# Patient Record
Sex: Female | Born: 1990
Health system: Southern US, Community
[De-identification: ages and names within clinical notes are randomized; demographics above are authoritative.]

## PROBLEM LIST (undated history)

## (undated) DIAGNOSIS — L509 Urticaria, unspecified: Secondary | ICD-10-CM

## (undated) DIAGNOSIS — J45909 Unspecified asthma, uncomplicated: Secondary | ICD-10-CM

## (undated) DIAGNOSIS — D509 Iron deficiency anemia, unspecified: Secondary | ICD-10-CM

## (undated) DIAGNOSIS — T7840XA Allergy, unspecified, initial encounter: Secondary | ICD-10-CM

## (undated) HISTORY — PX: WISDOM TOOTH EXTRACTION: SHX21

## (undated) HISTORY — DX: Allergy, unspecified, initial encounter: T78.40XA

## (undated) HISTORY — DX: Urticaria, unspecified: L50.9

## (undated) HISTORY — DX: Iron deficiency anemia, unspecified: D50.9

---
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
## 2012-10-01 ENCOUNTER — Emergency Department: Payer: Self-pay | Admitting: Emergency Medicine

## 2012-10-01 LAB — URINALYSIS, COMPLETE
Bilirubin,UR: NEGATIVE
Blood: NEGATIVE
Ketone: NEGATIVE
Ph: 6 (ref 4.5–8.0)
Protein: NEGATIVE
RBC,UR: <1 /HPF (ref 0–5)
Specific Gravity: 1.019 (ref 1.003–1.030)
Squamous Epithelial: 3

## 2012-10-01 LAB — COMPREHENSIVE METABOLIC PANEL
Albumin: 3.9 g/dL (ref 3.8–5.6)
Alkaline Phosphatase: 78 U/L — ABNORMAL LOW (ref 82–169)
Bilirubin,Total: 0.3 mg/dL (ref 0.2–1.0)
Creatinine: 0.65 mg/dL (ref 0.60–1.30)
Osmolality: 278 (ref 275–301)
Total Protein: 7.5 g/dL (ref 6.4–8.6)

## 2012-10-01 LAB — CBC
HCT: 39.3 % (ref 35.0–47.0)
HGB: 12.6 g/dL (ref 12.0–16.0)
MCH: 27.3 pg (ref 26.0–34.0)
MCHC: 32.1 g/dL (ref 32.0–36.0)
Platelet: 325 10*3/uL (ref 150–440)
RBC: 4.61 10*6/uL (ref 3.80–5.20)
WBC: 8.2 10*3/uL (ref 3.6–11.0)

## 2013-01-17 ENCOUNTER — Emergency Department: Payer: Self-pay | Admitting: Emergency Medicine

## 2013-05-29 ENCOUNTER — Emergency Department: Payer: Self-pay | Admitting: Emergency Medicine

## 2013-05-29 LAB — CBC
MCV: 82 fL (ref 80–100)
Platelet: 343 10*3/uL (ref 150–440)
RBC: 4.8 10*6/uL (ref 3.80–5.20)
WBC: 10.8 10*3/uL (ref 3.6–11.0)

## 2013-05-29 LAB — GC/CHLAMYDIA PROBE AMP

## 2013-05-29 LAB — URINALYSIS, COMPLETE
Bacteria: NONE SEEN
Blood: NEGATIVE
Glucose,UR: NEGATIVE mg/dL (ref 0–75)
Nitrite: NEGATIVE
Ph: 5 (ref 4.5–8.0)
RBC,UR: <1 /HPF (ref 0–5)
Specific Gravity: 1.021 (ref 1.003–1.030)
Squamous Epithelial: 2

## 2013-05-29 LAB — COMPREHENSIVE METABOLIC PANEL
Albumin: 3.5 g/dL (ref 3.4–5.0)
Calcium, Total: 8.9 mg/dL (ref 8.5–10.1)
EGFR (African American): >60
SGOT(AST): 18 U/L (ref 15–37)
SGPT (ALT): 15 U/L (ref 12–78)
Total Protein: 7.1 g/dL (ref 6.4–8.2)

## 2013-05-29 LAB — LIPASE, BLOOD: Lipase: 68 U/L — ABNORMAL LOW (ref 73–393)

## 2013-05-29 LAB — WET PREP, GENITAL

## 2013-07-09 ENCOUNTER — Emergency Department: Payer: Self-pay | Admitting: Emergency Medicine

## 2013-07-10 LAB — CBC WITH DIFFERENTIAL/PLATELET
Basophil #: 0.1 10*3/uL (ref 0.0–0.1)
HCT: 38.9 % (ref 35.0–47.0)
Lymphocyte %: 15.7 %
Monocyte #: 0.8 x10 3/mm (ref 0.2–0.9)
Neutrophil %: 76.3 %
Platelet: 327 10*3/uL (ref 150–440)
RBC: 4.76 10*6/uL (ref 3.80–5.20)
RDW: 15.8 % — ABNORMAL HIGH (ref 11.5–14.5)

## 2013-07-10 LAB — DRUG SCREEN, URINE
Amphetamines, Ur Screen: NEGATIVE (ref ?–1000)
Cocaine Metabolite,Ur ~~LOC~~: NEGATIVE (ref ?–300)
MDMA (Ecstasy)Ur Screen: NEGATIVE (ref ?–500)
Tricyclic, Ur Screen: NEGATIVE (ref ?–1000)

## 2013-07-10 LAB — URINALYSIS, COMPLETE
Bacteria: NONE SEEN
Squamous Epithelial: 2
WBC UR: 27 /HPF (ref 0–5)

## 2013-07-10 LAB — BASIC METABOLIC PANEL
Anion Gap: 9 (ref 7–16)
BUN: 10 mg/dL (ref 7–18)
Calcium, Total: 8.7 mg/dL (ref 8.5–10.1)
Co2: 20 mmol/L — ABNORMAL LOW (ref 21–32)
EGFR (African American): >60
Potassium: 4.3 mmol/L (ref 3.5–5.1)

## 2013-07-10 LAB — ETHANOL: Ethanol: <3 mg/dL

## 2013-07-10 LAB — ACETAMINOPHEN LEVEL: Acetaminophen: <2 ug/mL

## 2013-07-10 LAB — TSH: Thyroid Stimulating Horm: 1.95 u[IU]/mL

## 2013-09-08 ENCOUNTER — Emergency Department: Payer: Self-pay | Admitting: Emergency Medicine

## 2014-11-23 ENCOUNTER — Emergency Department (HOSPITAL_COMMUNITY)
Admission: EM | Admit: 2014-11-23 | Discharge: 2014-11-23 | Disposition: A | Discharge status: Home / Self Care | Payer: 59 | Attending: Emergency Medicine | Admitting: Emergency Medicine

## 2014-11-23 ENCOUNTER — Encounter (HOSPITAL_COMMUNITY): Payer: Self-pay | Admitting: Emergency Medicine

## 2014-11-23 DIAGNOSIS — R110 Nausea: Secondary | ICD-10-CM | POA: Insufficient documentation

## 2014-11-23 DIAGNOSIS — Z3202 Encounter for pregnancy test, result negative: Secondary | ICD-10-CM | POA: Insufficient documentation

## 2014-11-23 DIAGNOSIS — R1031 Right lower quadrant pain: Secondary | ICD-10-CM | POA: Diagnosis present

## 2014-11-23 DIAGNOSIS — R109 Unspecified abdominal pain: Secondary | ICD-10-CM

## 2014-11-23 LAB — CBC WITH DIFFERENTIAL/PLATELET
Basophils Absolute: 0.1 10*3/uL (ref 0.0–0.1)
Basophils Relative: 1 % (ref 0–1)
Eosinophils Absolute: 0.2 10*3/uL (ref 0.0–0.7)
Eosinophils Relative: 3 % (ref 0–5)
HCT: 36.8 % (ref 36.0–46.0)
Hemoglobin: 11.7 g/dL — ABNORMAL LOW (ref 12.0–15.0)
Lymphocytes Relative: 21 % (ref 12–46)
Lymphs Abs: 2.1 10*3/uL (ref 0.7–4.0)
MCH: 25.5 pg — ABNORMAL LOW (ref 26.0–34.0)
MCHC: 31.8 g/dL (ref 30.0–36.0)
MCV: 80.3 fL (ref 78.0–100.0)
Monocytes Absolute: 0.7 10*3/uL (ref 0.1–1.0)
Monocytes Relative: 7 % (ref 3–12)
Neutro Abs: 6.6 10*3/uL (ref 1.7–7.7)
Neutrophils Relative %: 68 % (ref 43–77)
Platelets: 428 10*3/uL — ABNORMAL HIGH (ref 150–400)
RBC: 4.58 MIL/uL (ref 3.87–5.11)
RDW: 15.7 % — ABNORMAL HIGH (ref 11.5–15.5)
WBC: 9.7 10*3/uL (ref 4.0–10.5)

## 2014-11-23 LAB — PREGNANCY, URINE: Preg Test, Ur: NEGATIVE

## 2014-11-23 LAB — COMPREHENSIVE METABOLIC PANEL
ALBUMIN: 4 g/dL (ref 3.5–5.2)
ALK PHOS: 72 U/L (ref 39–117)
ALT: 17 U/L (ref 0–35)
ANION GAP: 5 (ref 5–15)
AST: 19 U/L (ref 0–37)
BILIRUBIN TOTAL: 0.4 mg/dL (ref 0.3–1.2)
BUN: 11 mg/dL (ref 6–23)
CHLORIDE: 107 meq/L (ref 96–112)
CO2: 24 mmol/L (ref 19–32)
CREATININE: 0.7 mg/dL (ref 0.50–1.10)
Calcium: 9.3 mg/dL (ref 8.4–10.5)
GFR calc Af Amer: >90 mL/min (ref >90)
GFR calc non Af Amer: >90 mL/min (ref >90)
Glucose, Bld: 95 mg/dL (ref 70–99)
Potassium: 4.5 mmol/L (ref 3.5–5.1)
Sodium: 136 mmol/L (ref 135–145)
Total Protein: 7.7 g/dL (ref 6.0–8.3)

## 2014-11-23 LAB — URINALYSIS, ROUTINE W REFLEX MICROSCOPIC
Bilirubin Urine: NEGATIVE
Glucose, UA: NEGATIVE mg/dL
Ketones, ur: NEGATIVE mg/dL
Leukocytes, UA: NEGATIVE
Nitrite: NEGATIVE
Protein, ur: NEGATIVE mg/dL
Specific Gravity, Urine: 1.025 (ref 1.005–1.030)
Urobilinogen, UA: 0.2 mg/dL (ref 0.0–1.0)
pH: 6 (ref 5.0–8.0)

## 2014-11-23 LAB — URINE MICROSCOPIC-ADD ON

## 2014-11-23 MED ORDER — IBUPROFEN 800 MG PO TABS
800.0000 mg | ORAL_TABLET | Freq: Once | ORAL | Status: AC
  Administered 2014-11-23: 800 mg via ORAL
  Filled 2014-11-23: qty 1

## 2014-11-23 MED ORDER — IBUPROFEN 800 MG PO TABS: 800.0000 mg | ORAL_TABLET | Freq: Three times a day (TID) | ORAL | Status: DC | PRN

## 2014-11-23 NOTE — ED Provider Notes (Signed)
CSN: [id number]     Arrival date & time 11/23/14  1650 History   First MD Initiated Contact with Patient 11/23/14 1659     This chart was scribed for Brenda Lennert, MD by [name], ED Scribe. This patient was seen in room APA15/APA15 and the patient's care was started 5:00 PM.   Chief Complaint  Patient presents with  . Abdominal Pain   Patient is a 24 y.o. female presenting with abdominal pain. The history is provided by the patient. No language interpreter was used.  Abdominal Pain Pain location:  RLQ Pain radiates to:  Does not radiate Pain severity:  Moderate Duration:  1 day Timing:  Constant Progression:  Worsening Chronicity:  Recurrent Relieved by:  None tried Worsened by:  Nothing tried Ineffective treatments:  None tried Associated symptoms: nausea   Associated symptoms: no chills, no diarrhea, no fever and no vomiting   Nausea:    Severity:  Mild   Onset quality:  Gradual   Duration:  1 day   Timing:  Constant   Progression:  Worsening   HPI Comments: [name] is a 24 y.o. female with a PMHx of ovarian cyst who presents to the Emergency Department complaining of constant, moderate RLQ abdominal pain x 1 day that has progressively worsened today. Pt also reports associated nausea. She has not tried any OTC medications or home remedies to help manage symptoms. No recent vomiting, diarrhea, fever, or urinary issues. Pt was seen previously approximately 3 years ago for same and was diagnosed with an ovarian cyst. No recent follow up since last ED visit. No known allergies to medications.  History reviewed. No pertinent past medical history. History reviewed. No pertinent past surgical history. History reviewed. No pertinent family history. History  Substance Use Topics  . Smoking status: Never Smoker   . Smokeless tobacco: Never Used  . Alcohol Use: No   OB History    No data available     Review of Systems  Constitutional: Negative for fever and  chills.  Gastrointestinal: Positive for nausea and abdominal pain. Negative for vomiting and diarrhea.  All other systems reviewed and are negative.     Allergies  Review of patient's allergies indicates no known allergies.  Home Medications   Prior to Admission medications   Not on File   Triage Vitals: BP 103/76 mmHg  Pulse 88  Temp(Src) 98.8 F (37.1 C) (Oral)  Resp 17  Ht  (1.626 m)  Wt 220 lb 12.8 oz (100.154 kg)  BMI 37.88 kg/m2  SpO2 100%  LMP 11/23/2014   Physical Exam  Constitutional: She is oriented to person, place, and time. She appears well-developed.  HENT:  Head: Normocephalic.  Eyes: Conjunctivae and EOM are normal. No scleral icterus.  Neck: Neck supple. No thyromegaly present.  Cardiovascular: Normal rate and regular rhythm.  Exam reveals no gallop and no friction rub.   No murmur heard. Pulmonary/Chest: No stridor. She has no wheezes. She has no rales. She exhibits no tenderness.  Abdominal: She exhibits no distension. There is tenderness. There is no rebound.  Mild RLQ abdominal tenderness noted  Musculoskeletal: Normal range of motion. She exhibits no edema.  Lymphadenopathy:    She has no cervical adenopathy.  Neurological: She is oriented to person, place, and time. She exhibits normal muscle tone. Coordination normal.  Skin: No rash noted. No erythema.  Psychiatric: She has a normal mood and affect. Her behavior is normal.    ED Course  Procedures (including critical care time)  DIAGNOSTIC STUDIES: Oxygen Saturation is 100% on RA, Normal by my interpretation.    COORDINATION OF CARE: 5:10 PM- Will order urinalysis, CBC, CMP, and pregnancy urine. Will give ibuprofen. Discussed treatment plan with pt at bedside and pt agreed to plan.     Labs Review Labs Reviewed  URINALYSIS, ROUTINE W REFLEX MICROSCOPIC  CBC WITH DIFFERENTIAL  COMPREHENSIVE METABOLIC PANEL    Imaging Review No results found.   EKG Interpretation None       MDM   Final diagnoses:  None  abd pain,   Pt to follow up with gyn md,  Possible ovarian cyst   I personally performed the services described in this documentation, which was scribed in my presence. The recorded information has been reviewed and is accurate.    Brenda LennertJoseph L Catherene Kaleta, MD 11/23/14 [phone]

## 2014-11-23 NOTE — Discharge Instructions (Signed)
Follow up with your gyn md next week. °

## 2014-11-23 NOTE — ED Notes (Signed)
Patient c/o right lower quad pain with nausea that started yesterday but progressively getting worse. Denies any vomiting, diarrhea, fevers, or urinary symptoms.

## 2015-07-24 ENCOUNTER — Emergency Department: Payer: 59

## 2015-07-24 ENCOUNTER — Emergency Department
Admission: EM | Admit: 2015-07-24 | Discharge: 2015-07-25 | Disposition: A | Discharge status: Home / Self Care | Payer: 59 | Attending: Emergency Medicine | Admitting: Emergency Medicine

## 2015-07-24 DIAGNOSIS — R05 Cough: Secondary | ICD-10-CM | POA: Diagnosis not present

## 2015-07-24 DIAGNOSIS — R1013 Epigastric pain: Secondary | ICD-10-CM | POA: Diagnosis not present

## 2015-07-24 DIAGNOSIS — R079 Chest pain, unspecified: Secondary | ICD-10-CM | POA: Insufficient documentation

## 2015-07-24 LAB — BASIC METABOLIC PANEL
ANION GAP: 4 — AB (ref 5–15)
BUN: 13 mg/dL (ref 6–20)
CHLORIDE: 110 mmol/L (ref 101–111)
CO2: 24 mmol/L (ref 22–32)
Calcium: 8.9 mg/dL (ref 8.9–10.3)
Creatinine, Ser: 0.7 mg/dL (ref 0.44–1.00)
GFR calc non Af Amer: >60 mL/min (ref >60)
GLUCOSE: 97 mg/dL (ref 65–99)
Potassium: 3.7 mmol/L (ref 3.5–5.1)
Sodium: 138 mmol/L (ref 135–145)

## 2015-07-24 LAB — CBC
HCT: 35.2 % (ref 35.0–47.0)
HEMOGLOBIN: 11.1 g/dL — AB (ref 12.0–16.0)
MCH: 24.6 pg — AB (ref 26.0–34.0)
MCHC: 31.4 g/dL — ABNORMAL LOW (ref 32.0–36.0)
MCV: 78.3 fL — AB (ref 80.0–100.0)
Platelets: 352 10*3/uL (ref 150–440)
RBC: 4.49 MIL/uL (ref 3.80–5.20)
RDW: 17.6 % — ABNORMAL HIGH (ref 11.5–14.5)
WBC: 11.2 10*3/uL — ABNORMAL HIGH (ref 3.6–11.0)

## 2015-07-24 LAB — TROPONIN I: Troponin I: <0.03 ng/mL (ref <0.031)

## 2015-07-24 MED ORDER — ONDANSETRON 4 MG PO TBDP
4.0000 mg | ORAL_TABLET | Freq: Once | ORAL | Status: AC
  Administered 2015-07-25: 4 mg via ORAL
  Filled 2015-07-24: qty 1

## 2015-07-24 MED ORDER — HYDROCODONE-ACETAMINOPHEN 5-325 MG PO TABS
1.0000 | ORAL_TABLET | Freq: Once | ORAL | Status: AC
  Administered 2015-07-25: 1 via ORAL
  Filled 2015-07-24: qty 1

## 2015-07-24 NOTE — ED Provider Notes (Signed)
[facility] Emergency Department Provider Note  ____________________________________________  Time seen: Approximately 11:36 PM  I have reviewed the triage vital signs and the nursing notes.   HISTORY  Chief Complaint Chest Pain    HPI [name] is a 24 y.o. female who presents to the ED from home with a chief complaint of chest pain. Patient describes onset of substernal chest pain upon awakening 2 days ago. Describes occasional sharp pain which increases with movement and deep breathing. HEENT occasionally radiates to her right arm. Denies associated diaphoresis, nausea or shortness of breath. Does state she has been having a nonproductive cough since that time as well. Denies fevers, chills, vomiting, diarrhea, dysuria, calf pain or swelling. Denies recent travel or surgery. Nothing makes the pain better. Deep breathing, movement and food makes the pain worse.   Past medical history Asthma   There are no active problems to display for this patient.   Past surgical history Wisdom teeth extraction  Current Outpatient Rx  Name  Route  Sig  Dispense  Refill  . albuterol (PROVENTIL HFA;VENTOLIN HFA) 108 (90 BASE) MCG/ACT inhaler   Inhalation   Inhale 2 puffs into the lungs every 6 (six) hours as needed for wheezing or shortness of breath.         Marland Kitchen ibuprofen (ADVIL,MOTRIN) 800 MG tablet   Oral   Take 1 tablet (800 mg total) by mouth every 8 (eight) hours as needed.   21 tablet   0     Allergies Review of patient's allergies indicates no known allergies.  Family history Grandfather with diabetes and ? blood clot   Social History Social History  Substance Use Topics  . Smoking status: Never Smoker   . Smokeless tobacco: Never Used  . Alcohol Use: No    Review of Systems Constitutional: No fever/chills Eyes: No visual changes. ENT: No sore throat. Cardiovascular: Positive for chest pain. Respiratory: Denies shortness of  breath. Gastrointestinal: Positive for abdominal pain.  No nausea, no vomiting.  No diarrhea.  No constipation. Genitourinary: Negative for dysuria. Musculoskeletal: Negative for back pain. Skin: Negative for rash. Neurological: Negative for headaches, focal weakness or numbness.  10-point ROS otherwise negative.  ____________________________________________   PHYSICAL EXAM:  VITAL SIGNS: ED Triage Vitals  Enc Vitals Group     BP 07/24/15 2143 124/68 mmHg     Pulse Rate 07/24/15 2143 84     Resp 07/24/15 2143 18     Temp 07/24/15 2143 98.8 F (37.1 C)     Temp Source 07/24/15 2143 Oral     SpO2 07/24/15 2143 99 %     Weight 07/24/15 2143 215 lb (97.523 kg)     Height 07/24/15 2143  (1.626 m)     Head Cir --      Peak Flow --      Pain Score 07/24/15 2143 8     Pain Loc --      Pain Edu? --      Excl. in GC? --     Constitutional: Alert and oriented. Well appearing and in no acute distress. Eyes: Conjunctivae are normal. PERRL. EOMI. Head: Atraumatic. Nose: No congestion/rhinnorhea. Mouth/Throat: Mucous membranes are moist.  Oropharynx non-erythematous. Neck: No stridor.   Cardiovascular: Normal rate, regular rhythm. Grossly normal heart sounds.  Good peripheral circulation. Respiratory: Normal respiratory effort.  No retractions. Lungs CTAB. Gastrointestinal: Soft, mildly tender to palpation epigastrium without rebound or guarding. No distention. No abdominal bruits. No CVA tenderness.  Musculoskeletal: No lower extremity tenderness nor edema.  No joint effusions. Neurologic:  Normal speech and language. No gross focal neurologic deficits are appreciated. No gait instability. Skin:  Skin is warm, dry and intact. No rash noted. Psychiatric: Mood and affect are normal. Speech and behavior are normal.  ____________________________________________   LABS (all labs ordered are listed, but only abnormal results are displayed)  Labs Reviewed  BASIC METABOLIC PANEL -  Abnormal; Notable for the following:    Anion gap 4 (*)    All other components within normal limits  CBC - Abnormal; Notable for the following:    WBC 11.2 (*)    Hemoglobin 11.1 (*)    MCV 78.3 (*)    MCH 24.6 (*)    MCHC 31.4 (*)    RDW 17.6 (*)    All other components within normal limits  LIPASE, BLOOD - Abnormal; Notable for the following:    Lipase 21 (*)    All other components within normal limits  HEPATIC FUNCTION PANEL - Abnormal; Notable for the following:    ALT 10 (*)    Bilirubin, Direct <0.1 (*)    All other components within normal limits  TROPONIN I  FIBRIN DERIVATIVES D-DIMER ([facility] ONLY)   ____________________________________________  EKG  ED ECG REPORT I, SUNG,JADE J, the attending physician, personally viewed and interpreted this ECG.   Date: 07/24/2015  EKG Time: 2149  Rate: 79  Rhythm: normal EKG, normal sinus rhythm  Axis: Normal  Intervals:none  ST&T Change: Nonspecific  ____________________________________________  RADIOLOGY  Chest x-ray 2 view (viewed by me, interpreted per Dr. Andria Meuse): No active cardiopulmonary disease.  Ultrasound abdomen limited RUQ interpreted per Dr. Andria Meuse: Normal examination of the liver, gallbladder, and visualized bile ducts. ____________________________________________   PROCEDURES  Procedure(s) performed: None  Critical Care performed: No  ____________________________________________   INITIAL IMPRESSION / ASSESSMENT AND PLAN / ED COURSE  Pertinent labs & imaging results that were available during my care of the patient were reviewed by me and considered in my medical decision making (see chart for details).  24 year old female who presents with substernal chest/epigastric discomfort. Will add LFTs, lipase, d-dimer; obtain ultrasound to evaluate biliary tree, administer analgesic and reassess.  ----------------------------------------- 1:15 AM on  07/25/2015 -----------------------------------------  Patient improved. Updated patient of laboratory and imaging results. Strict return precautions given. Patient verbalizes understanding and agrees with plan of care. ____________________________________________   FINAL CLINICAL IMPRESSION(S) / ED DIAGNOSES  Final diagnoses:  Chest pain, unspecified chest pain type  Epigastric pain      Irean Hong, MD 07/25/15 [phone]

## 2015-07-24 NOTE — ED Notes (Signed)
Pt states that she started having chest pain under her left breast Friday, pt denies injury, states the pain increases with deep breathing, pt states that as she is walking up stairs it causes her to breathe harder, pt states that she had a cough Friday, no fevers. Pt states that her rt arm is feeling tingling intermittently since awaking this am, no distress noted at this time, pt ambulatory to triage without diff, pt speaking in complete sentences

## 2015-07-24 NOTE — ED Notes (Signed)
Pt states that she has been having epigastric/left sided chest pain for the past couple days, denies nausea, states that she had a cough on Friday but none today, states that she has pain to touch on the left side of her chest under her left breast. Pt also states that she has been having some rt arm pain intermittently today since she woke up, pt states that she initially thought she slept wrong but states that the pain has persisted throughout the day despite how she uses and moves her arm, pt has a strong radial pulse and the temperature of each arm is equal, cap refill within normal limits

## 2015-07-24 NOTE — ED Notes (Signed)
Dr sung to bedside 

## 2015-07-25 ENCOUNTER — Emergency Department: Payer: 59

## 2015-07-25 ENCOUNTER — Other Ambulatory Visit: Payer: 59

## 2015-07-25 LAB — HEPATIC FUNCTION PANEL
ALBUMIN: 3.6 g/dL (ref 3.5–5.0)
ALK PHOS: 70 U/L (ref 38–126)
ALT: 10 U/L — AB (ref 14–54)
AST: 18 U/L (ref 15–41)
BILIRUBIN TOTAL: 0.4 mg/dL (ref 0.3–1.2)
Bilirubin, Direct: <0.1 mg/dL — ABNORMAL LOW (ref 0.1–0.5)
Total Protein: 6.9 g/dL (ref 6.5–8.1)

## 2015-07-25 LAB — FIBRIN DERIVATIVES D-DIMER (ARMC ONLY): FIBRIN DERIVATIVES D-DIMER (ARMC): 361 (ref 0–499)

## 2015-07-25 LAB — LIPASE, BLOOD: Lipase: 21 U/L — ABNORMAL LOW (ref 22–51)

## 2015-07-25 MED ORDER — IBUPROFEN 800 MG PO TABS: 800.0000 mg | ORAL_TABLET | Freq: Three times a day (TID) | ORAL | Status: DC | PRN

## 2015-07-25 MED ORDER — HYDROCODONE-ACETAMINOPHEN 5-325 MG PO TABS: 1.0000 | ORAL_TABLET | Freq: Four times a day (QID) | ORAL | Status: DC | PRN

## 2015-07-25 NOTE — Discharge Instructions (Signed)
1. Take pain medicines as needed (Motrin/Norco #15). °2. Apply moist heat to affected area several times daily. °3. Return to the ER for worsening symptoms, persistent vomiting, difficulty breathing or other concerns. ° °Chest Pain (Nonspecific) °It is often hard to give a specific diagnosis for the cause of chest pain. There is always a chance that your pain could be related to something serious, such as a heart attack or a blood clot in the lungs. You need to follow up with your health care provider for further evaluation. °CAUSES  °· Heartburn. °· Pneumonia or bronchitis. °· Anxiety or stress. °· Inflammation around your heart (pericarditis) or lung (pleuritis or pleurisy). °· A blood clot in the lung. °· A collapsed lung (pneumothorax). It can develop suddenly on its own (spontaneous pneumothorax) or from trauma to the chest. °· Shingles infection (herpes zoster virus). °The chest wall is composed of bones, muscles, and cartilage. Any of these can be the source of the pain. °· The bones can be bruised by injury. °· The muscles or cartilage can be strained by coughing or overwork. °· The cartilage can be affected by inflammation and become sore (costochondritis). °DIAGNOSIS  °Lab tests or other studies may be needed to find the cause of your pain. Your health care provider may have you take a test called an ambulatory electrocardiogram (ECG). An ECG records your heartbeat patterns over a 24-hour period. You may also have other tests, such as: °· Transthoracic echocardiogram (TTE). During echocardiography, sound waves are used to evaluate how blood flows through your heart. °· Transesophageal echocardiogram (TEE). °· Cardiac monitoring. This allows your health care provider to monitor your heart rate and rhythm in real time. °· Holter monitor. This is a portable device that records your heartbeat and can help diagnose heart arrhythmias. It allows your health care provider to track your heart activity for several  days, if needed. °· Stress tests by exercise or by giving medicine that makes the heart beat faster. °TREATMENT  °· Treatment depends on what may be causing your chest pain. Treatment may include: °· Acid blockers for heartburn. °· Anti-inflammatory medicine. °· Pain medicine for inflammatory conditions. °· Antibiotics if an infection is present. °· You may be advised to change lifestyle habits. This includes stopping smoking and avoiding alcohol, caffeine, and chocolate. °· You may be advised to keep your head raised (elevated) when sleeping. This reduces the chance of acid going backward from your stomach into your esophagus. °Most of the time, nonspecific chest pain will improve within 2-3 days with rest and mild pain medicine.  °HOME CARE INSTRUCTIONS  °· If antibiotics were prescribed, take them as directed. Finish them even if you start to feel better. °· For the next few days, avoid physical activities that bring on chest pain. Continue physical activities as directed. °· Do not use any tobacco products, including cigarettes, chewing tobacco, or electronic cigarettes. °· Avoid drinking alcohol. °· Only take medicine as directed by your health care provider. °· Follow your health care provider's suggestions for further testing if your chest pain does not go away. °· Keep any follow-up appointments you made. If you do not go to an appointment, you could develop lasting (chronic) problems with pain. If there is any problem keeping an appointment, call to reschedule. °SEEK MEDICAL CARE IF:  °· Your chest pain does not go away, even after treatment. °· You have a rash with blisters on your chest. °· You have a fever. °SEEK IMMEDIATE MEDICAL CARE IF:  °·   You have increased chest pain or pain that spreads to your arm, neck, jaw, back, or abdomen.  You have shortness of breath.  You have an increasing cough, or you cough up blood.  You have severe back or abdominal pain.  You feel nauseous or vomit.  You  have severe weakness.  You faint.  You have chills. This is an emergency. Do not wait to see if the pain will go away. Get medical help at once. Call your local emergency services (911 in [place] Do not drive yourself to the hospital. MAKE SURE YOU:   Understand these instructions.  Will watch your condition.  Will get help right away if you are not doing well or get worse. Document Released: 08/01/2005 Document Revised: 10/27/2013 Document Reviewed: 05/27/2008 [facility] Patient Information 2015 [place], [place]. This information is not intended to replace advice given to you by your health care provider. Make sure you discuss any questions you have with your health care provider.  Abdominal Pain Many things can cause abdominal pain. Usually, abdominal pain is not caused by a disease and will improve without treatment. It can often be observed and treated at home. Your health care provider will do a physical exam and possibly order blood tests and X-rays to help determine the seriousness of your pain. However, in many cases, more time must pass before a clear cause of the pain can be found. Before that point, your health care provider may not know if you need more testing or further treatment. HOME CARE INSTRUCTIONS  Monitor your abdominal pain for any changes. The following actions may help to alleviate any discomfort you are experiencing:  Only take over-the-counter or prescription medicines as directed by your health care provider.  Do not take laxatives unless directed to do so by your health care provider.  Try a clear liquid diet (broth, tea, or water) as directed by your health care provider. Slowly move to a bland diet as tolerated. SEEK MEDICAL CARE IF:  You have unexplained abdominal pain.  You have abdominal pain associated with nausea or diarrhea.  You have pain when you urinate or have a bowel movement.  You experience abdominal pain that wakes you in the night.  You  have abdominal pain that is worsened or improved by eating food.  You have abdominal pain that is worsened with eating fatty foods.  You have a fever. SEEK IMMEDIATE MEDICAL CARE IF:   Your pain does not go away within 2 hours.  You keep throwing up (vomiting).  Your pain is felt only in portions of the abdomen, such as the right side or the left lower portion of the abdomen.  You pass bloody or black tarry stools. MAKE SURE YOU:  Understand these instructions.   Will watch your condition.   Will get help right away if you are not doing well or get worse.  Document Released: 08/01/2005 Document Revised: 10/27/2013 Document Reviewed: 07/01/2013 [facility] Patient Information 2015 [place], [place]. This information is not intended to replace advice given to you by your health care provider. Make sure you discuss any questions you have with your health care provider.

## 2015-07-25 NOTE — ED Notes (Signed)
[place]

## 2015-08-02 ENCOUNTER — Ambulatory Visit: Payer: Self-pay | Admitting: Family Medicine

## 2015-08-04 ENCOUNTER — Ambulatory Visit: Payer: Self-pay | Admitting: Family Medicine

## 2015-08-16 ENCOUNTER — Ambulatory Visit: Payer: Self-pay | Admitting: Family Medicine

## 2015-08-24 ENCOUNTER — Ambulatory Visit: Payer: Self-pay | Admitting: Family Medicine

## 2016-02-20 ENCOUNTER — Encounter: Payer: Self-pay | Admitting: Emergency Medicine

## 2016-02-20 DIAGNOSIS — Z791 Long term (current) use of non-steroidal anti-inflammatories (NSAID): Secondary | ICD-10-CM | POA: Diagnosis not present

## 2016-02-20 DIAGNOSIS — R21 Rash and other nonspecific skin eruption: Secondary | ICD-10-CM | POA: Diagnosis present

## 2016-02-20 DIAGNOSIS — L253 Unspecified contact dermatitis due to other chemical products: Secondary | ICD-10-CM | POA: Insufficient documentation

## 2016-02-20 NOTE — ED Notes (Signed)
Patient ambulatory to triage with steady gait, without difficulty or distress noted; pt reports being seen by dermatologist 3wks ago for itchy rash and dx with "hives"; rx medication x week without relief

## 2016-02-21 ENCOUNTER — Emergency Department
Admission: EM | Admit: 2016-02-21 | Discharge: 2016-02-21 | Disposition: A | Discharge status: Home / Self Care | Payer: 59 | Attending: Emergency Medicine | Admitting: Emergency Medicine

## 2016-02-21 DIAGNOSIS — L259 Unspecified contact dermatitis, unspecified cause: Secondary | ICD-10-CM

## 2016-02-21 MED ORDER — TRIAMCINOLONE ACETONIDE 0.1 % EX OINT: 1.0000 "application " | TOPICAL_OINTMENT | Freq: Two times a day (BID) | CUTANEOUS | Status: DC

## 2016-02-21 NOTE — Discharge Instructions (Signed)
You were seen in the emergency department tonight due to a diffuse rash on your skin. This appears to be a contact dermatitis due to chemicals that you are exposed to in the workplace. Please refer to the MSDS sheet your employer keeps on these chemicals for further information about potential toxicity from exposure and how best to manage it. This is likely to continue happening and to continue developing worse and worse reactions as you continue to be exposed to the chemicals on your skin. Please ask your employer to provide waterproof (rubberized) skin protecting gear while working around these chemicals, or asked to be assigned a different role where you no longer will be exposed to these chemicals.  Contact Dermatitis Dermatitis is redness, soreness, and swelling (inflammation) of the skin. Contact dermatitis is a reaction to certain substances that touch the skin. There are two types of contact dermatitis:   Irritant contact dermatitis. This type is caused by something that irritates your skin, such as dry hands from washing them too much. This type does not require previous exposure to the substance for a reaction to occur. This type is more common.  Allergic contact dermatitis. This type is caused by a substance that you are allergic to, such as a nickel allergy or poison ivy. This type only occurs if you have been exposed to the substance (allergen) before. Upon a repeat exposure, your body reacts to the substance. This type is less common. CAUSES  Many different substances can cause contact dermatitis. Irritant contact dermatitis is most commonly caused by exposure to:   Makeup.   Soaps.   Detergents.   Bleaches.   Acids.   Metal salts, such as nickel.  Allergic contact dermatitis is most commonly caused by exposure to:   Poisonous plants.   Chemicals.   Jewelry.   Latex.   Medicines.   Preservatives in products, such as clothing.  RISK FACTORS This condition  is more likely to develop in:   People who have jobs that expose them to irritants or allergens.  People who have certain medical conditions, such as asthma or eczema.  SYMPTOMS  Symptoms of this condition may occur anywhere on your body where the irritant has touched you or is touched by you. Symptoms include:  Dryness or flaking.   Redness.   Cracks.   Itching.   Pain or a burning feeling.   Blisters.  Drainage of small amounts of blood or clear fluid from skin cracks. With allergic contact dermatitis, there may also be swelling in areas such as the eyelids, mouth, or genitals.  DIAGNOSIS  This condition is diagnosed with a medical history and physical exam. A patch skin test may be performed to help determine the cause. If the condition is related to your job, you may need to see an occupational medicine specialist. TREATMENT Treatment for this condition includes figuring out what caused the reaction and protecting your skin from further contact. Treatment may also include:   Steroid creams or ointments. Oral steroid medicines may be needed in more severe cases.  Antibiotics or antibacterial ointments, if a skin infection is present.  Antihistamine lotion or an antihistamine taken by mouth to ease itching.  A bandage (dressing). HOME CARE INSTRUCTIONS Skin Care  Moisturize your skin as needed.   Apply cool compresses to the affected areas.  Try taking a bath with:  Epsom salts. Follow the instructions on the packaging. You can get these at your local pharmacy or grocery store.  Baking soda.  Pour a small amount into the bath as directed by your health care provider.  Colloidal oatmeal. Follow the instructions on the packaging. You can get this at your local pharmacy or grocery store.  Try applying baking soda paste to your skin. Stir water into baking soda until it reaches a paste-like consistency.  Do not scratch your skin.  Bathe less frequently, such  as every other day.  Bathe in lukewarm water. Avoid using hot water. Medicines  Take or apply over-the-counter and prescription medicines only as told by your health care provider.   If you were prescribed an antibiotic medicine, take or apply your antibiotic as told by your health care provider. Do not stop using the antibiotic even if your condition starts to improve. General Instructions  Keep all follow-up visits as told by your health care provider. This is important.  Avoid the substance that caused your reaction. If you do not know what caused it, keep a journal to try to track what caused it. Write down:  What you eat.  What cosmetic products you use.  What you drink.  What you wear in the affected area. This includes jewelry.  If you were given a dressing, take care of it as told by your health care provider. This includes when to change and remove it. SEEK MEDICAL CARE IF:   Your condition does not improve with treatment.  Your condition gets worse.  You have signs of infection such as swelling, tenderness, redness, soreness, or warmth in the affected area.  You have a fever.  You have new symptoms. SEEK IMMEDIATE MEDICAL CARE IF:   You have a severe headache, neck pain, or neck stiffness.  You vomit.  You feel very sleepy.  You notice red streaks coming from the affected area.  Your bone or joint underneath the affected area becomes painful after the skin has healed.  The affected area turns darker.  You have difficulty breathing.   This information is not intended to replace advice given to you by your health care provider. Make sure you discuss any questions you have with your health care provider.   Document Released: 10/19/2000 Document Revised: 07/13/2015 Document Reviewed: 03/09/2015 Else[employer] Interactive Patient Education 2016 [employer]  Rash A rash is a change in the color or texture of the skin. There are many different types of  rashes. You may have other problems that accompany your rash. CAUSES   Infections.  Allergic reactions. This can include allergies to pets or foods.  Certain medicines.  Exposure to certain chemicals, soaps, or cosmetics.  Heat.  Exposure to poisonous plants.  Tumors, both cancerous and noncancerous. SYMPTOMS   Redness.  Scaly skin.  Itchy skin.  Dry or cracked skin.  Bumps.  Blisters.  Pain. DIAGNOSIS  Your caregiver may do a physical exam to determine what type of rash you have. A skin sample (biopsy) may be taken and examined under a microscope. TREATMENT  Treatment depends on the type of rash you have. Your caregiver may prescribe certain medicines. For serious conditions, you may need to see a skin doctor (dermatologist). HOME CARE INSTRUCTIONS   Avoid the substance that caused your rash.  Do not scratch your rash. This can cause infection.  You may take cool baths to help stop itching.  Only take over-the-counter or prescription medicines as directed by your caregiver.  Keep all follow-up appointments as directed by your caregiver. SEEK IMMEDIATE MEDICAL CARE IF:  You have increasing pain, swelling, or redness.  You have a fever.  You have new or severe symptoms.  You have body aches, diarrhea, or vomiting.  Your rash is not better after 3 days. MAKE SURE YOU:  Understand these instructions.  Will watch your condition.  Will get help right away if you are not doing well or get worse.   This information is not intended to replace advice given to you by your health care provider. Make sure you discuss any questions you have with your health care provider.   Document Released: 10/12/2002 Document Revised: 11/12/2014 Document Reviewed: 03/09/2015 [employer] Interactive Patient Education [employer]

## 2016-02-21 NOTE — ED Provider Notes (Signed)
[facility] Emergency Department Provider Note  ____________________________________________  Time seen: 4:10 AM  I have reviewed the triage vital signs and the nursing notes.   HISTORY  Chief Complaint Rash    HPI Brenda MuldersCandace [name] Ebony CargoClayton is a 25 y.o. female who complains of diffuse skin rash for the past 3 weeks. She saw a dermatologist and was diagnosed with hives and started on antihistamine. 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 She notes that she is exposed to this chemical on a daily basis and that she is not provided personal protective equipment to guard against skin exposure. Denies trouble swallowing or breathing. No wheezing, no vomiting or abdominal pain.  Rash is on exposed areas of the thighs and lower legs and arms. None on the chest abdomen or back. Red bumps that are itchy.   History reviewed. No pertinent past medical history.   There are no active problems to display for this patient.    History reviewed. No pertinent past surgical history.   Current Outpatient Rx  Name  Route  Sig  Dispense  Refill  . albuterol (PROVENTIL HFA;VENTOLIN HFA) 108 (90 BASE) MCG/ACT inhaler   Inhalation   Inhale 2 puffs into the lungs every 6 (six) hours as needed for wheezing or shortness of breath.         Marland Kitchen. HYDROcodone-acetaminophen (NORCO) 5-325 MG per tablet   Oral   Take 1 tablet by mouth every 6 (six) hours as needed for moderate pain.   15 tablet   0   . ibuprofen (ADVIL,MOTRIN) 800 MG tablet   Oral   Take 1 tablet (800 mg total) by mouth every 8 (eight) hours as needed for moderate pain.   15 tablet   0   . triamcinolone ointment (KENALOG) 0.1 %   Topical   Apply 1 application topically 2 (two) times daily.   30 g   0      Allergies Review of patient's allergies indicates no known allergies.   No family history on file.  Social History Social  History  Substance Use Topics  . Smoking status: Never Smoker   . Smokeless tobacco: Never Used  . Alcohol Use: No    Review of Systems  Constitutional:   No fever or chills.  ENT:   No sore throat. No rhinorrhea. Cardiovascular:   No chest pain. Respiratory:   No dyspnea or cough. Gastrointestinal:   Negative for abdominal pain, vomiting and diarrhea.  No bloody stool. Musculoskeletal:   Negative for focal pain or swelling Neurological:   Negative for headaches 10-point ROS otherwise negative.  ____________________________________________   PHYSICAL EXAM:  VITAL SIGNS: ED Triage Vitals  Enc Vitals Group     BP 02/20/16 2303 121/55 mmHg     Pulse Rate 02/20/16 2303 88     Resp 02/20/16 2303 18     Temp 02/20/16 2303 98 F (36.7 [name])     Temp Source 02/20/16 2303 Oral     SpO2 02/20/16 2303 100 %     Weight 02/20/16 2303 215 lb (97.523 kg)     Height 02/20/16 2303 5\' 4"  (1.626 m)     Head Cir --      Peak Flow --      Pain Score --      Pain Loc --      Pain Edu? --      Excl. in GC? --  Vital signs reviewed, nursing assessments reviewed.   Constitutional:   Alert and oriented. Well appearing and in no distress. Eyes:   No scleral icterus. No conjunctival pallor. PERRL. EOMI ENT   Head:   Normocephalic and atraumatic.      Mouth/Throat:   MMM, no pharyngeal erythema. No peritonsillar mass. No intraoral lesions   Neck:   No stridor. No SubQ emphysema. No meningismus. Musculoskeletal:   Nontender with normal range of motion in all extremities. No joint effusions.  No lower extremity tenderness.  No edema. Neurologic:   Normal speech and language.  CN 2-10 normal. Motor grossly intact. No gross focal neurologic deficits are appreciated.  Skin:    Skin is warm, dry and intact. On the exposed areas of the upper and lower extremities, there are multiple lesions scattered at random which are erythematous, approximately 15-20 mm in diameter, round.  Consistent color throughout. Nontender or warm or indurated or draining. No crusting. Slightly raised in the center. No streaking. No petechia purpura or bullae  ____________________________________________    LABS (pertinent positives/negatives) (all labs ordered are listed, but only abnormal results are displayed) Labs Reviewed - No data to display ____________________________________________   EKG    ____________________________________________    RADIOLOGY    ____________________________________________   PROCEDURES   ____________________________________________   INITIAL IMPRESSION / ASSESSMENT AND PLAN / ED COURSE  Pertinent labs & imaging results that were available during my care of the patient were reviewed by me and considered in my medical decision making (see chart for details).  Patient presents with a skin rash which by history is likely to be contact dermatitis. Low suspicion for infectious etiology or urticaria or allergy. No evidence of anaphylaxis or severe allergic reaction. This is likely to remain self-limited to areas of exposure to chemicals in the workplace. Patient's been counseled against this and will seek assistance from her employer to minimize future exposure. We'll try topical steroids on the lesions to see if this helps. To follow up with her dermatologist again.     ____________________________________________   FINAL CLINICAL IMPRESSION(S) / ED DIAGNOSES  Final diagnoses:  Contact dermatitis       Portions of this note were generated with dragon dictation software. Dictation errors may occur despite best attempts at proofreading.   Sharman Cheek, MD 02/21/16 [id number]

## 2016-03-13 ENCOUNTER — Encounter: Payer: Self-pay | Admitting: Family Medicine

## 2016-03-13 ENCOUNTER — Ambulatory Visit (INDEPENDENT_AMBULATORY_CARE_PROVIDER_SITE_OTHER): Payer: BLUE CROSS/BLUE SHIELD | Admitting: Family Medicine

## 2016-03-13 VITALS — BP 122/72 | HR 99 | Temp 98.6°F | Resp 16 | Ht 64.0 in | Wt 240.0 lb

## 2016-03-13 DIAGNOSIS — D509 Iron deficiency anemia, unspecified: Secondary | ICD-10-CM | POA: Diagnosis not present

## 2016-03-13 DIAGNOSIS — R635 Abnormal weight gain: Secondary | ICD-10-CM

## 2016-03-13 DIAGNOSIS — M79671 Pain in right foot: Secondary | ICD-10-CM | POA: Diagnosis not present

## 2016-03-13 DIAGNOSIS — M79641 Pain in right hand: Secondary | ICD-10-CM

## 2016-03-13 DIAGNOSIS — L509 Urticaria, unspecified: Secondary | ICD-10-CM | POA: Diagnosis not present

## 2016-03-13 DIAGNOSIS — M79642 Pain in left hand: Secondary | ICD-10-CM

## 2016-03-13 DIAGNOSIS — M79672 Pain in left foot: Secondary | ICD-10-CM | POA: Diagnosis not present

## 2016-03-13 DIAGNOSIS — R200 Anesthesia of skin: Secondary | ICD-10-CM

## 2016-03-13 DIAGNOSIS — R208 Other disturbances of skin sensation: Secondary | ICD-10-CM | POA: Diagnosis not present

## 2016-03-13 HISTORY — DX: Iron deficiency anemia, unspecified: D50.9

## 2016-03-13 LAB — POCT CBG (FASTING - GLUCOSE)-MANUAL ENTRY: Glucose Fasting, POC: 99 mg/dL (ref 70–99)

## 2016-03-13 MED ORDER — GABAPENTIN 300 MG PO CAPS: 300.0000 mg | ORAL_CAPSULE | Freq: Three times a day (TID) | ORAL | Status: DC

## 2016-03-13 NOTE — Progress Notes (Signed)
BP 122/72 mmHg  Pulse 99  Temp(Src) 98.6 F (37 C) (Oral)  Resp 16  Ht 5\' 4"  (1.626 m)  Wt 240 lb (108.863 kg)  BMI 41.18 kg/m2  SpO2 98%  LMP 02/20/2016 (Exact Date)   Subjective:    Patient ID: [name], female    DOB: 01/15/1991, 25 y.o.   MRN: [id number]  HPI: [name] is a 25 y.o. female  Chief Complaint  Patient presents with  . Numbness    in bilateral hands and feet onset off and on for 2 years.  Recently went to ER and was given gabapentnin   She has had episodic flare ups of pain in the bottom of the feet and palms of the hands; the pain lasts about 30 minutes straight; going on for 2 years; worst episode ever and could not even walk [facility]; was given gabapentin 300 mg TID x 15 days; helped some; not aware of vitamin deficiency; not vegan or vegetarian  Taking hydroxyzine for allergic reaction, they are trying to figure out where that is coming from; going on for over a month now; out of work x 2 weeks; itching constantly, keeping her from working; 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, agriculturalchemical reaction to a coolant, works at [employer]; they did labs for her; hospital wrote her out for a week for the gabapentin; had labs done last Thursday  Labs in Sept 2016 showed low hemoglobin and small pale RBCs; a little tired; no blood loss; no excessive bruising or bleeding  Weight gain over last year; forty to fifty pounds; no one in family wth thyroid disease; not heavy periods; no constipation  Depression screen PHQ 2/9 03/13/2016  Decreased Interest 0  Down, Depressed, Hopeless 0  PHQ - 2 Score 0   Relevant past medical, surgical, family and social history reviewed Past Medical History  Diagnosis Date  . Hives   . Allergy   . Microcytic anemia 03/13/2016   No past surgical history on file. Family History  Problem Relation Age of Onset  . Diabetes Father   . Diabetes Maternal Grandfather   . Cancer Maternal Grandfather     lung   Social History  Substance Use  Topics  . Smoking status: Never Smoker   . Smokeless tobacco: Never Used  . Alcohol Use: No   Interim medical history since last visit reviewed. Allergies and medications reviewed  Review of Systems Per HPI unless specifically indicated above     Objective:    BP 122/72 mmHg  Pulse 99  Temp(Src) 98.6 F (37 C) (Oral)  Resp 16  Ht 5\' 4"  (1.626 m)  Wt 240 lb (108.863 kg)  BMI 41.18 kg/m2  SpO2 98%  LMP 02/20/2016 (Exact Date)  Wt Readings from Last 3 Encounters:  03/13/16 240 lb (108.863 kg)  02/20/16 215 lb (97.523 kg)  07/24/15 215 lb (97.523 kg)  MD note: 215 pounds in Sept to 240 today; middle weight (April) is incorrect she says  Physical Exam  Constitutional: She appears well-developed and well-nourished.  Morbidly obse female, nad  HENT:  Mouth/Throat: Mucous membranes are normal.  Eyes: EOM are normal. No scleral icterus.  Cardiovascular: Normal rate and regular rhythm.   Pulmonary/Chest: Effort normal and breath sounds normal.  Musculoskeletal: She exhibits no edema.  Neurological: She is alert. She has normal strength. She displays no tremor.  No atrophy of muscles of the hands  Skin: Rash noted. Rash is urticarial.  Mildly erythematous, arms  Psychiatric: She has a normal mood and affect. Her behavior is normal. Her mood appears not anxious. She does not exhibit a depressed mood.    Results for orders placed or performed in visit on 03/13/16  POCT CBG (Fasting - Glucose)  Result Value Ref Range   Glucose Fasting, POC 99 70 - 99 mg/dL      Assessment & Plan:   Problem List Items Addressed This Visit      Musculoskeletal and Integument   Urticaria    Being seen by dermatologist; I recommended that she contact the treating doctor to see about work, what to do about any additional meds or treatment        Other   Abnormal weight gain   Relevant Orders   Comprehensive metabolic panel   TSH   Microcytic anemia   Relevant Orders   CBC with  Differential/Platelet   Ferritin   Pain in both feet   Relevant Orders   NCV with EMG(electromyography)   Comprehensive metabolic panel   Vitamin B12   Pain in both hands   Relevant Orders   NCV with EMG(electromyography)   Comprehensive metabolic panel   Vitamin B12    Other Visit Diagnoses    Numbness in feet    -  Primary    Relevant Orders    POCT CBG (Fasting - Glucose) (Completed)    NCV with EMG(electromyography)    Morbid obesity due to excess calories (HCC)        Relevant Orders    Comprehensive metabolic panel    TSH    Lipid Panel w/o Chol/HDL Ratio       Follow up plan: No Follow-up on file.  An after-visit summary was printed and given to the patient at check-out.  Please see the patient instructions which may contain other information and recommendations beyond what is mentioned above in the assessment and plan.  Meds ordered this encounter  Medications  . DISCONTD: gabapentin (NEURONTIN) 300 MG capsule    Sig: Take 1 capsule by mouth 3 (three) times daily.  . hydrOXYzine (ATARAX/VISTARIL) 25 MG tablet    Sig: Take 1 tablet by mouth daily.    Refill:  0  . gabapentin (NEURONTIN) 300 MG capsule    Sig: Take 1 capsule (300 mg total) by mouth 3 (three) times daily.    Dispense:  90 capsule    Refill:  0    Orders Placed This Encounter  Procedures  . CBC with Differential/Platelet  . Ferritin  . Comprehensive metabolic panel  . Vitamin B12  . TSH  . Lipid Panel w/o Chol/HDL Ratio  . POCT CBG (Fasting - Glucose)  . NCV with EMG(electromyography)

## 2016-03-13 NOTE — Patient Instructions (Addendum)
Please contact Dr. Gwen PoundsKowalski about work, whether or not you should return to work Have labs done today Return in 2 weeks for follow-up

## 2016-03-27 ENCOUNTER — Ambulatory Visit: Payer: BLUE CROSS/BLUE SHIELD | Admitting: Family Medicine

## 2016-04-02 ENCOUNTER — Telehealth: Payer: Self-pay | Admitting: Family Medicine

## 2016-04-02 DIAGNOSIS — L509 Urticaria, unspecified: Secondary | ICD-10-CM | POA: Insufficient documentation

## 2016-04-02 NOTE — Assessment & Plan Note (Signed)
Being seen by dermatologist; I recommended that she contact the treating doctor to see about work, what to do about any additional meds or treatment

## 2016-04-02 NOTE — Telephone Encounter (Signed)
Please ask patient to have her labs done fasting sometime this week; thank you

## 2016-04-03 NOTE — Telephone Encounter (Signed)
Left voice mail

## 2016-04-17 ENCOUNTER — Telehealth: Payer: Self-pay | Admitting: Family Medicine

## 2016-04-17 NOTE — Telephone Encounter (Signed)
Please ask patient to have the fasting labs done soon; these are the one ordered January 12, 2016; thank you

## 2016-04-17 NOTE — Telephone Encounter (Signed)
Left voicemail.  This is the second time notifying  Patient to get labs done.

## 2016-08-18 ENCOUNTER — Encounter: Payer: Self-pay | Admitting: Family Medicine

## 2016-08-18 NOTE — Progress Notes (Signed)
Patient was notified in May and again in June to have labs done (see notes 04/02/16 and 04/17/16) She no showed for her appt on 03/27/16 I still have not received results from any labs I am cancelling the orders She is welcome to schedule an appt

## 2016-09-06 IMAGING — US US ABDOMEN LIMITED
1 series · 14 of 25 positions shown · non-contrast
Comparison: 05/29/2013

CLINICAL DATA: Epigastric pain for 3 days.

EXAM:
US ABDOMEN LIMITED - RIGHT UPPER QUADRANT

[Series 1: us abdomen limited · 0.21mm/px · 14 of 31 slices shown]
[im 1/31]
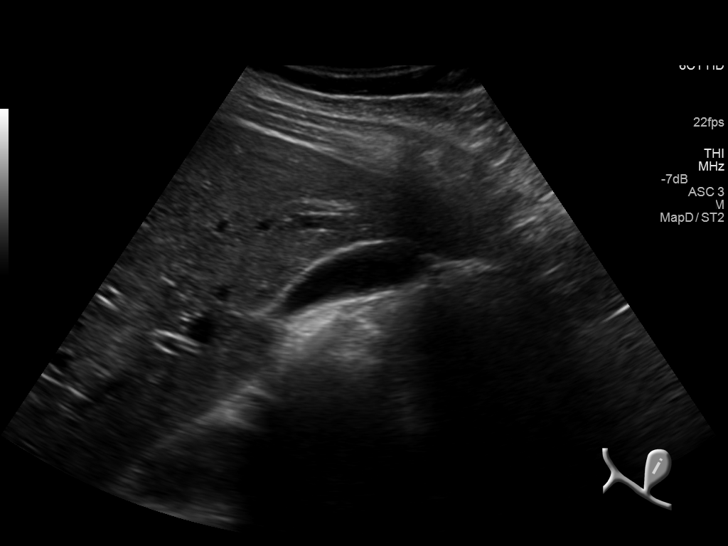
[im 3/31]
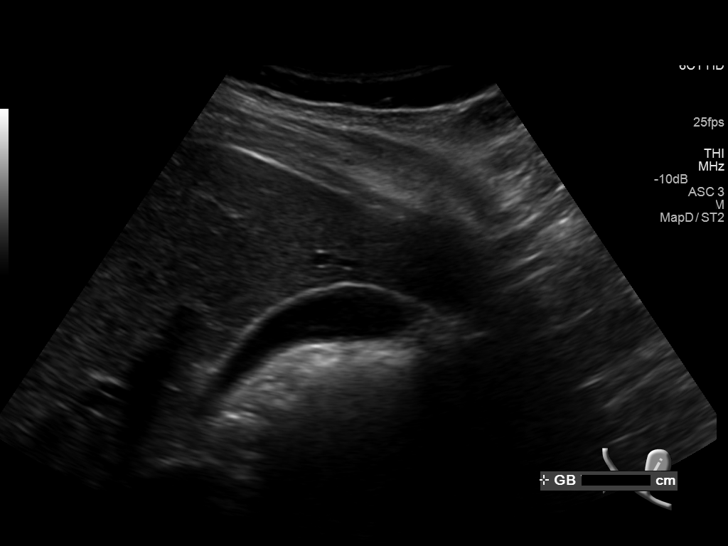
[im 6/31]
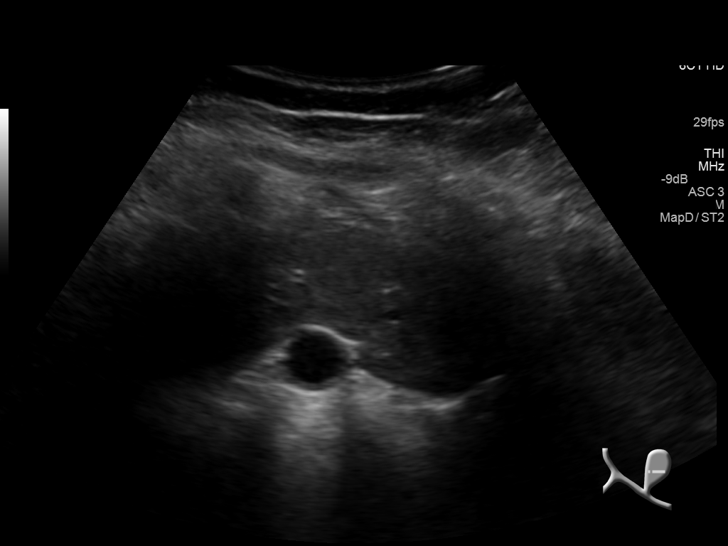
[im 8/31]
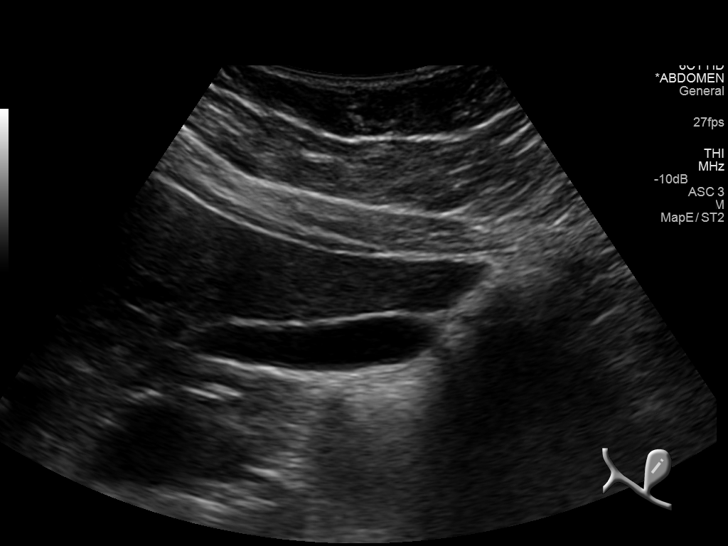
[im 11/31]
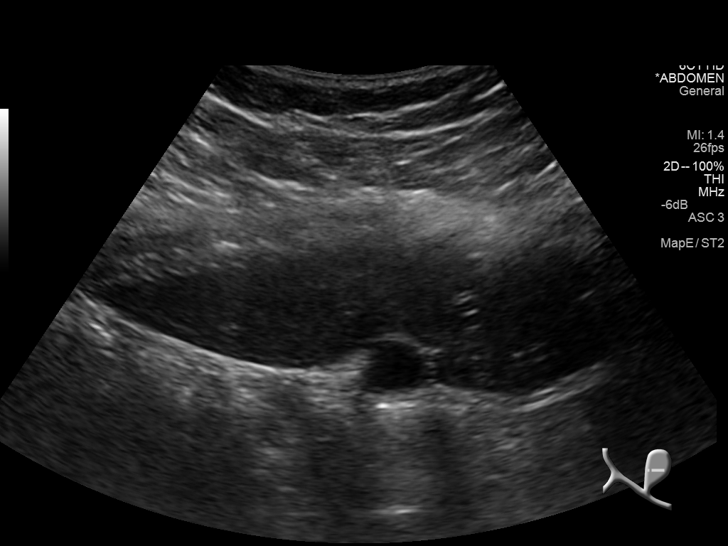
[im 12/31]
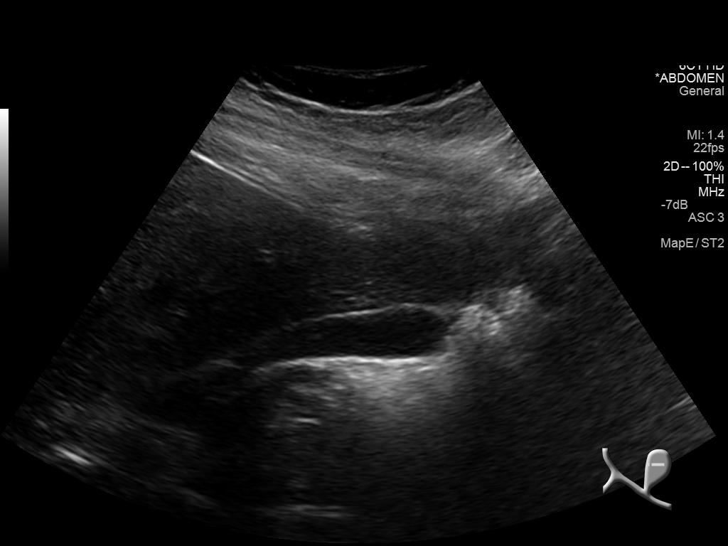
[im 14/31]
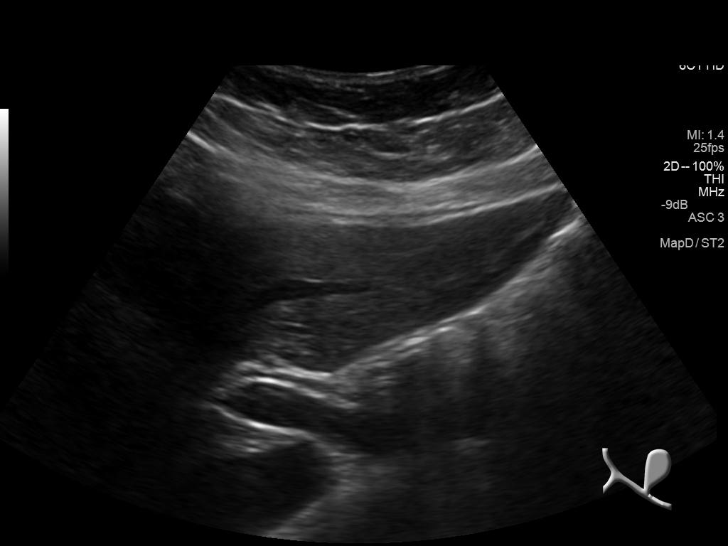
[im 17/31]
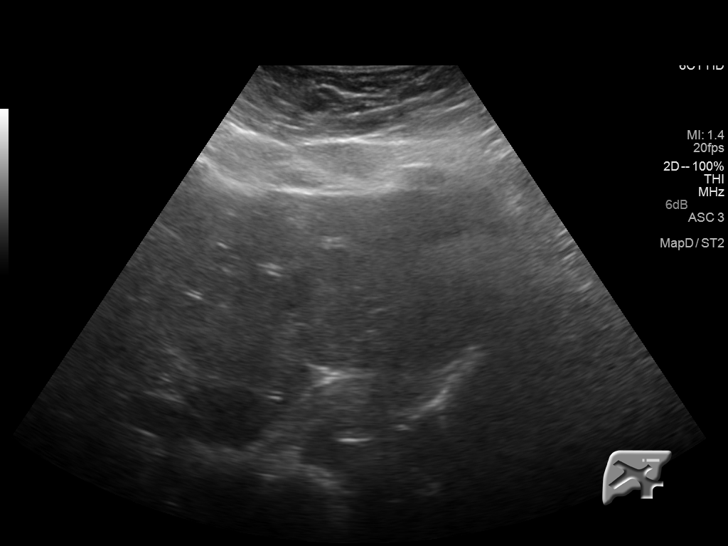
[im 19/31]
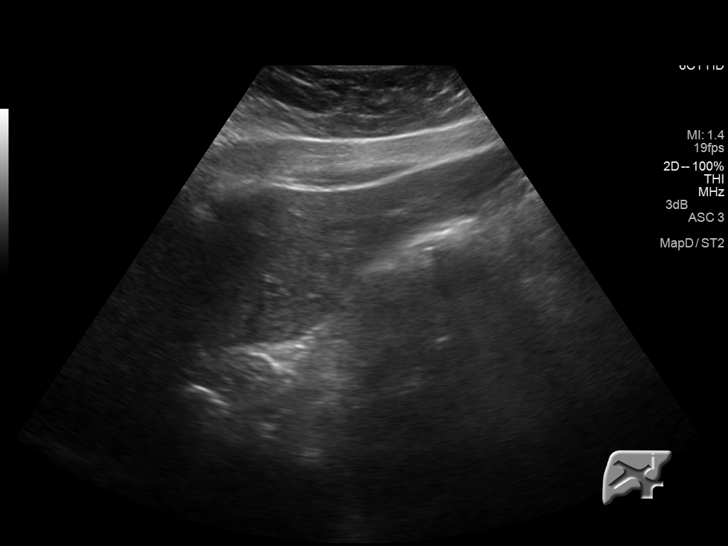
[im 21/31]
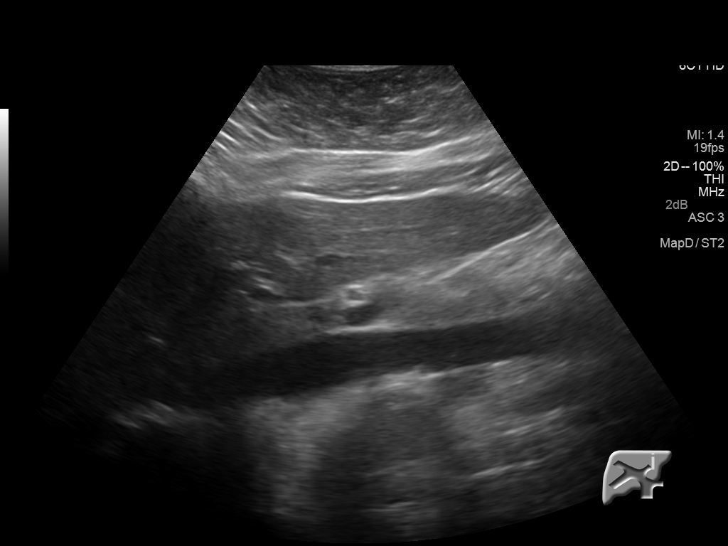
[im 23/31]
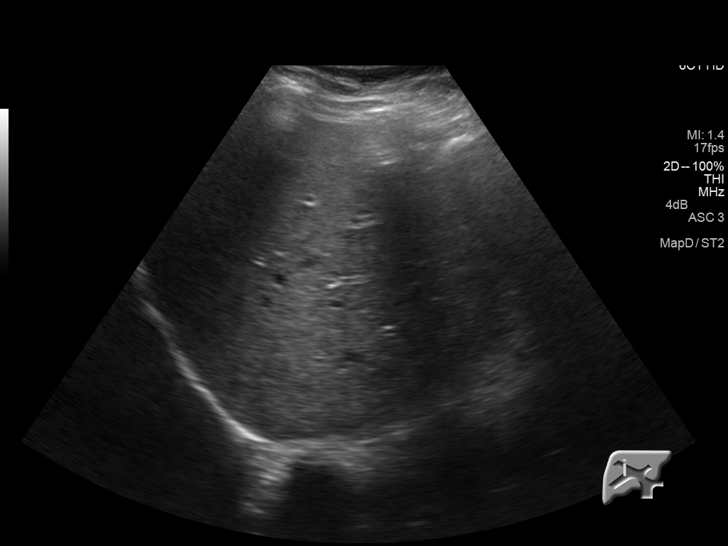
[im 26/31]
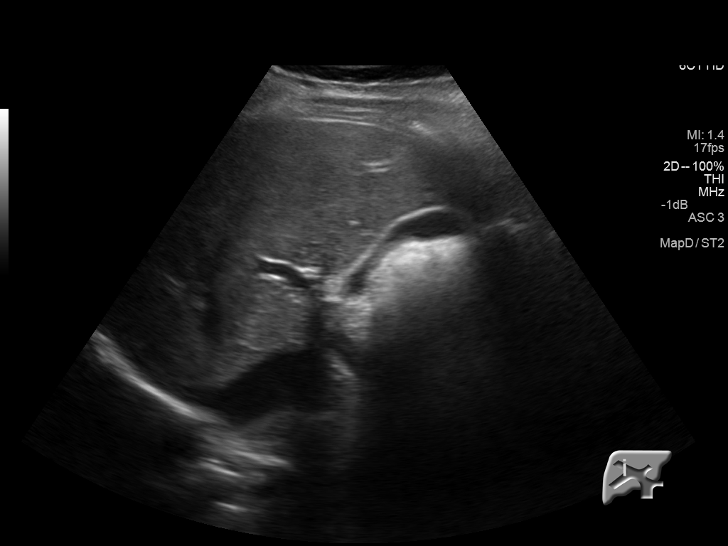
[im 28/31]
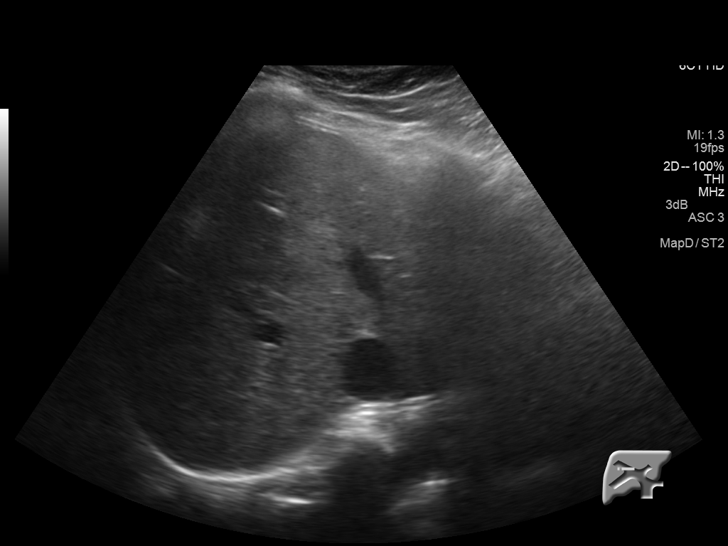
[im 31/31]
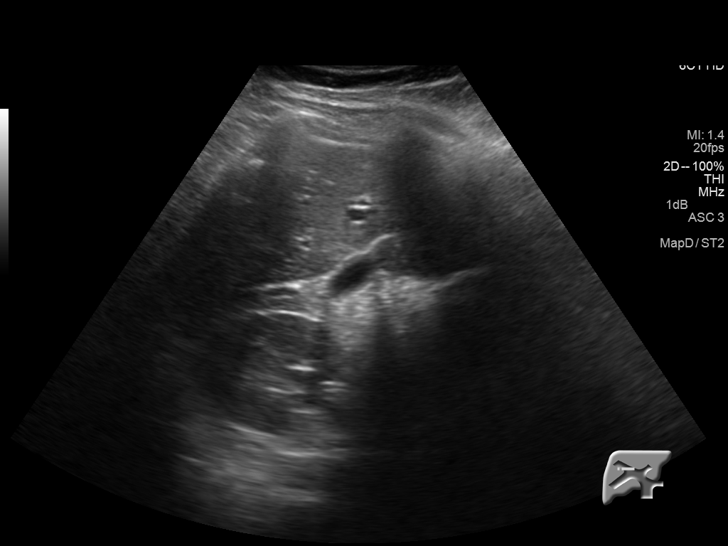

[14 of 25 positions shown; findings below may reference images not displayed]

FINDINGS: Gallbladder:

No gallstones or wall thickening visualized. No sonographic Murphy
sign noted.

Common bile duct:

Diameter: 2.4 mm, normal

Liver:

No focal lesion identified. Within normal limits in parenchymal
echogenicity.
IMPRESSION: Normal examination of the liver, gallbladder, and visualized bile
ducts.

## 2017-02-12 ENCOUNTER — Emergency Department
Admission: EM | Admit: 2017-02-12 | Discharge: 2017-02-12 | Disposition: A | Discharge status: Home / Self Care | Payer: 59 | Attending: Emergency Medicine | Admitting: Emergency Medicine

## 2017-02-12 ENCOUNTER — Encounter: Payer: Self-pay | Admitting: Emergency Medicine

## 2017-02-12 DIAGNOSIS — J029 Acute pharyngitis, unspecified: Secondary | ICD-10-CM | POA: Diagnosis present

## 2017-02-12 DIAGNOSIS — B349 Viral infection, unspecified: Secondary | ICD-10-CM

## 2017-02-12 NOTE — Discharge Instructions (Signed)
Follow-up with your primary care doctor if any continued problems. Remain on clear liquids today and then gradually add back toast, rice, applesauce and bananas. Obtain Claritin D or Zyrtec-D over-the-counter for congestion and runny nose. If cough medicine dictation is still needed you may obtain Delsym over-the-counter every 12 hours.

## 2017-02-12 NOTE — ED Provider Notes (Signed)
[facility] Emergency Department Provider Note   ____________________________________________   First MD Initiated Contact with Patient 02/12/17 1040     (approximate)  I have reviewed the triage vital signs and the nursing notes.   HISTORY  Chief Complaint Sore Throat; Nasal Congestion; and Chills    HPI [name] is a 26 y.o. female is here complaining of sore throat, nasal congestion all weekend. Patient states that she's also had some sore throat and subjective fever and chills. Patient states she has taken over-the-counter medication including Mucinex without any relief. She states that her nose continues to run clear fluid. Patient has also had some sneezing. She rates her discomfort as a 9/10.   Past Medical History:  Diagnosis Date  . Allergy   . Hives   . Microcytic anemia 03/13/2016    Patient Active Problem List   Diagnosis Date Noted  . Urticaria 04/02/2016  . Microcytic anemia 03/13/2016  . Pain in both hands 03/13/2016  . Pain in both feet 03/13/2016  . Abnormal weight gain 03/13/2016    History reviewed. No pertinent surgical history.  Prior to Admission medications   Medication Sig Start Date End Date Taking? Authorizing Provider  albuterol (PROVENTIL HFA;VENTOLIN HFA) 108 (90 BASE) MCG/ACT inhaler Inhale 2 puffs into the lungs every 6 (six) hours as needed for wheezing or shortness of breath.    Historical Provider, MD  gabapentin (NEURONTIN) 300 MG capsule Take 1 capsule (300 mg total) by mouth 3 (three) times daily. 03/13/16   Kerman Passey, MD  hydrOXYzine (ATARAX/VISTARIL) 25 MG tablet Take 1 tablet by mouth daily. 03/12/16   Historical Provider, MD    Allergies Patient has no known allergies.  Family History  Problem Relation Age of Onset  . Diabetes Father   . Diabetes Maternal Grandfather   . Cancer Maternal Grandfather     lung    Social History Social History  Substance Use Topics  . Smoking status:  Never Smoker  . Smokeless tobacco: Never Used  . Alcohol use No    Review of Systems Constitutional: Subjective fever/chills Eyes: No visual changes. ENT: Positive sore throat. Cardiovascular: Denies chest pain. Respiratory: Denies shortness of breath. Positive cough. Gastrointestinal: No abdominal pain.  No nausea, no vomiting.  Positive diarrhea.  No constipation. Musculoskeletal: Negative for muscle aches. Skin: Negative for rash. Neurological: Negative for headaches, focal weakness or numbness.  10-point ROS otherwise negative.  ____________________________________________   PHYSICAL EXAM:  VITAL SIGNS: ED Triage Vitals [02/12/17 1018]  Enc Vitals Group     BP 115/74     Pulse Rate (!) 110     Resp 14     Temp 98.4 F (36.9 C)     Temp Source Oral     SpO2 100 %     Weight 230 lb (104.3 kg)     Height  (1.626 m)     Head Circumference      Peak Flow      Pain Score 9     Pain Loc      Pain Edu?      Excl. in GC?     Constitutional: Alert and oriented. Well appearing and in no acute distress. Eyes: Conjunctivae are normal. PERRL. EOMI. Head: Atraumatic. Nose: Mild congestion/rhinnorhea.  EACs are clear. TMs are dull. Mouth/Throat: Mucous membranes are moist.  Oropharynx non-erythematous. Moderate posterior drainage seen. Neck: No stridor.   Hematological/Lymphatic/Immunilogical: No cervical lymphadenopathy. Cardiovascular: Normal rate, regular rhythm. Grossly  normal heart sounds.  Good peripheral circulation. Respiratory: Normal respiratory effort.  No retractions. Lungs CTAB. Gastrointestinal: Soft and nontender. No distention. Bowel sounds normoactive 4 quadrants. Musculoskeletal: Moves upper and lower extremities without any difficulty. Normal gait was noted. Neurologic:  Normal speech and language. No gross focal neurologic deficits are appreciated. No gait instability. Skin:  Skin is warm, dry and intact. No rash noted. Psychiatric: Mood and  affect are normal. Speech and behavior are normal.  ____________________________________________   LABS (all labs ordered are listed, but only abnormal results are displayed)  Labs Reviewed - No data to display  PROCEDURES  Procedure(s) performed: None  Procedures  Critical Care performed: No  ____________________________________________   INITIAL IMPRESSION / ASSESSMENT AND PLAN / ED COURSE  Pertinent labs & imaging results that were available during my care of the patient were reviewed by me and considered in my medical decision making (see chart for details).  Patient was encouraged to begin either Zyrtec-D or Claritin-D for congestion and runny nose. Patient's mother remain on clear liquids today and gradually add back toes, rice, applesauce and bananas. Patient also obtained Delsym over-the-counter if anything is needed for cough. She is to follow-up with her primary care if any continued problems.      ____________________________________________   FINAL CLINICAL IMPRESSION(S) / ED DIAGNOSES  Final diagnoses:  Viral illness      NEW MEDICATIONS STARTED DURING THIS VISIT:  Discharge Medication List as of 02/12/2017 10:57 AM       Note:  This document was prepared using Dragon voice recognition software and may include unintentional dictation errors.     Tommi Rumps, PA-C 02/12/17 1204    Jene Every, MD 02/12/17 1434

## 2017-02-12 NOTE — ED Notes (Signed)
States she developed runny nose and sore throat since this past week end   Throat red no swelling noted

## 2017-02-12 NOTE — ED Triage Notes (Signed)
Sore throat, nasal congestion all weekend

## 2017-09-20 ENCOUNTER — Encounter: Payer: Self-pay | Admitting: Family Medicine

## 2017-09-20 ENCOUNTER — Ambulatory Visit: Payer: 59 | Admitting: Family Medicine

## 2017-09-20 VITALS — BP 126/80 | HR 96 | Temp 98.4°F | Resp 18 | Ht 64.0 in | Wt 242.7 lb

## 2017-09-20 DIAGNOSIS — M546 Pain in thoracic spine: Secondary | ICD-10-CM

## 2017-09-20 DIAGNOSIS — N62 Hypertrophy of breast: Secondary | ICD-10-CM

## 2017-09-20 DIAGNOSIS — M79671 Pain in right foot: Secondary | ICD-10-CM

## 2017-09-20 DIAGNOSIS — M79672 Pain in left foot: Secondary | ICD-10-CM

## 2017-09-20 DIAGNOSIS — G8929 Other chronic pain: Secondary | ICD-10-CM | POA: Diagnosis not present

## 2017-09-20 MED ORDER — DICLOFENAC SODIUM 75 MG PO TBEC: 75.0000 mg | DELAYED_RELEASE_TABLET | Freq: Two times a day (BID) | ORAL | 0 refills | Status: DC

## 2017-09-20 MED ORDER — TIZANIDINE HCL 2 MG PO CAPS: 2.0000 mg | ORAL_CAPSULE | Freq: Three times a day (TID) | ORAL | 0 refills | Status: DC | PRN

## 2017-09-20 NOTE — Patient Instructions (Addendum)
Back Pain, Adult Back pain is very common. The pain often gets better over time. The cause of back pain is usually not dangerous. Most people can learn to manage their back pain on their own. Follow these instructions at home: Watch your back pain for any changes. The following actions may help to lessen any pain you are feeling:  Stay active. Start with short walks on flat ground if you can. Try to walk farther each day.  Exercise regularly as told by your doctor. Exercise helps your back heal faster. It also helps avoid future injury by keeping your muscles strong and flexible.  Do not sit, drive, or stand in one place for more than 30 minutes.  Do not stay in bed. Resting more than 1-2 days can slow down your recovery.  Be careful when you bend or lift an object. Use good form when lifting: ? Bend at your knees. ? Keep the object close to your body. ? Do not twist.  Sleep on a firm mattress. Lie on your side, and bend your knees. If you lie on your back, put a pillow under your knees.  Take medicines only as told by your doctor.  Put ice on the injured area. ? Put ice in a plastic bag. ? Place a towel between your skin and the bag. ? Leave the ice on for 20 minutes, 2-3 times a day for the first 2-3 days. After that, you can switch between ice and heat packs.  Avoid feeling anxious or stressed. Find good ways to deal with stress, such as exercise.  Maintain a healthy weight. Extra weight puts stress on your back.  Contact a doctor if:  You have pain that does not go away with rest or medicine.  You have worsening pain that goes down into your legs or buttocks.  You have pain that does not get better in one week.  You have pain at night.  You lose weight.  You have a fever or chills. Get help right away if:  You cannot control when you poop (bowel movement) or pee (urinate).  Your arms or legs feel weak.  Your arms or legs lose feeling (numbness).  You feel sick  to your stomach (nauseous) or throw up (vomit).  You have belly (abdominal) pain.  You feel like you may pass out (faint). This information is not intended to replace advice given to you by your health care provider. Make sure you discuss any questions you have with your health care provider. Document Released: 04/09/2008 Document Revised: 03/29/2016 Document Reviewed: 02/23/2014 Elsevier Interactive Patient Education  2018 [employer]  Back Exercises If you have pain in your back, do these exercises 2-3 times each day or as told by your doctor. When the pain goes away, do the exercises once each day, but repeat the steps more times for each exercise (do more repetitions). If you do not have pain in your back, do these exercises once each day or as told by your doctor. Exercises Single Knee to Chest  Do these steps 3-5 times in a row for each leg: 1. Lie on your back on a firm bed or the floor with your legs stretched out. 2. Bring one knee to your chest. 3. Hold your knee to your chest by grabbing your knee or thigh. 4. Pull on your knee until you feel a gentle stretch in your lower back. 5. Keep doing the stretch for 10-30 seconds. 6. Slowly let go of your leg   and straighten it.  Pelvic Tilt  Do these steps 5-10 times in a row: 1. Lie on your back on a firm bed or the floor with your legs stretched out. 2. Bend your knees so they point up to the ceiling. Your feet should be flat on the floor. 3. Tighten your lower belly (abdomen) muscles to press your lower back against the floor. This will make your tailbone point up to the ceiling instead of pointing down to your feet or the floor. 4. Stay in this position for 5-10 seconds while you gently tighten your muscles and breathe evenly.  Cat-Cow  Do these steps until your lower back bends more easily: 1. Get on your hands and knees on a firm surface. Keep your hands under your shoulders, and keep your knees under your hips. You may  put padding under your knees. 2. Let your head hang down, and make your tailbone point down to the floor so your lower back is round like the back of a cat. 3. Stay in this position for 5 seconds. 4. Slowly lift your head and make your tailbone point up to the ceiling so your back hangs low (sags) like the back of a cow. 5. Stay in this position for 5 seconds.  Press-Ups  Do these steps 5-10 times in a row: 1. Lie on your belly (face-down) on the floor. 2. Place your hands near your head, about shoulder-width apart. 3. While you keep your back relaxed and keep your hips on the floor, slowly straighten your arms to raise the top half of your body and lift your shoulders. Do not use your back muscles. To make yourself more comfortable, you may change where you place your hands. 4. Stay in this position for 5 seconds. 5. Slowly return to lying flat on the floor.  Bridges  Do these steps 10 times in a row: 1. Lie on your back on a firm surface. 2. Bend your knees so they point up to the ceiling. Your feet should be flat on the floor. 3. Tighten your butt muscles and lift your butt off of the floor until your waist is almost as high as your knees. If you do not feel the muscles working in your butt and the back of your thighs, slide your feet 1-2 inches farther away from your butt. 4. Stay in this position for 3-5 seconds. 5. Slowly lower your butt to the floor, and let your butt muscles relax.  If this exercise is too easy, try doing it with your arms crossed over your chest. Belly Crunches  Do these steps 5-10 times in a row: 1. Lie on your back on a firm bed or the floor with your legs stretched out. 2. Bend your knees so they point up to the ceiling. Your feet should be flat on the floor. 3. Cross your arms over your chest. 4. Tip your chin a little bit toward your chest but do not bend your neck. 5. Tighten your belly muscles and slowly raise your chest just enough to lift your  shoulder blades a tiny bit off of the floor. 6. Slowly lower your chest and your head to the floor.  Back Lifts Do these steps 5-10 times in a row: 1. Lie on your belly (face-down) with your arms at your sides, and rest your forehead on the floor. 2. Tighten the muscles in your legs and your butt. 3. Slowly lift your chest off of the floor while you keep your   hips on the floor. Keep the back of your head in line with the curve in your back. Look at the floor while you do this. 4. Stay in this position for 3-5 seconds. 5. Slowly lower your chest and your face to the floor.  Contact a doctor if:  Your back pain gets a lot worse when you do an exercise.  Your back pain does not lessen 2 hours after you exercise. If you have any of these problems, stop doing the exercises. Do not do them again unless your doctor says it is okay. Get help right away if:  You have sudden, very bad back pain. If this happens, stop doing the exercises. Do not do them again unless your doctor says it is okay. This information is not intended to replace advice given to you by your health care provider. Make sure you discuss any questions you have with your health care provider. Document Released: 11/24/2010 Document Revised: 03/29/2016 Document Reviewed: 12/16/2014 Elsevier Interactive Patient Education  2018 [employer]  

## 2017-09-20 NOTE — Progress Notes (Signed)
Name: [name]   MRN: [id number]    DOB: 08/29/1991   Date:09/20/2017       Progress Note  Subjective  Chief Complaint  Chief Complaint  Patient presents with  . Back Pain    for 2 weeks, due to large breast    HPI  Patient presents with concern for back pain, worse over the last 2-3 weeks. She works third shift at [employer] - she does not bend her back much, it is mostly repetitive arm motions. Patient has very large breasts and would like a referral to a surgeon to discuss her options. When she wakes up, her back is very tight and painful, worse in the thoracic region. Endorses occasional tingling in bilateral feet - goes away with activity.  She denies BUE numbness/tingling, extremity weakness. Patient is morbidly obese as well - she does some cardio and a little bit of weights twice a week.  She has been considering diet plans, and would like to see nutrition if they have evening appointments.  Patient Active Problem List   Diagnosis Date Noted  . Urticaria 04/02/2016  . Microcytic anemia 03/13/2016  . Pain in both hands 03/13/2016  . Pain in both feet 03/13/2016  . Abnormal weight gain 03/13/2016    Social History   Tobacco Use  . Smoking status: Never Smoker  . Smokeless tobacco: Never Used  Substance Use Topics  . Alcohol use: No    Alcohol/week: 0.0 oz     Current Outpatient Medications:  .  diclofenac (VOLTAREN) 75 MG EC tablet, Take 1 tablet (75 mg total) 2 (two) times daily by mouth., Disp: 30 tablet, Rfl: 0 .  tizanidine (ZANAFLEX) 2 MG capsule, Take 1-2 capsules (2-4 mg total) 3 (three) times daily as needed by mouth for muscle spasms. Take 1 capsule daily before bed for 2-3 days, then may increase to 2 capsules., Disp: 45 capsule, Rfl: 0  No Known Allergies  ROS  Constitutional: Negative for fever or weight change.  Respiratory: Negative for cough and shortness of breath.   Cardiovascular: Negative for chest pain or palpitations.   Gastrointestinal: Negative for abdominal pain, no bowel changes.  Musculoskeletal: Negative for gait problem or joint swelling. See HPI Skin: Negative for rash.  Neurological: Negative for dizziness or headache.  No other specific complaints in a complete review of systems (except as listed in HPI above).  Objective  Vitals:   09/20/17 0924 09/20/17 1010  BP: 126/80   Pulse: (!) 102 96  Resp: 18   Temp: 98.4 F (36.9 C)   TempSrc: Oral   SpO2: 96%   Weight: 242 lb 11.2 oz (110.1 kg)   Height: 5\' 4"  (1.626 m)    Body mass index is 41.66 kg/m.  Nursing Note and Vital Signs reviewed.  Physical Exam  Constitutional: Patient appears well-developed and well-nourished. Obese No distress.  HEENT: head atraumatic, normocephalic Cardiovascular: Normal rate, regular rhythm, S1/S2 present.  No murmur or rub heard. No BLE edema. Pulmonary/Chest: Effort normal and breath sounds clear. No respiratory distress or retractions. Psychiatric: Patient has a normal mood and affect. behavior is normal. Judgment and thought content normal. Musculoskeletal: Normal range of motion, no joint effusions. No gross deformities.  No bony/spinal tenderness.  Tenderness to bilateral musculature in the thoracic spine area. Neurological: he is alert and oriented to person, place, and time. No cranial nerve deficit. Coordination, balance, strength, speech and gait are normal.   No results found for this or any  previous visit (from the past 2160 hour(s)).   Assessment & Plan  1. Chronic bilateral thoracic back pain - Ambulatory referral to Plastic Surgery - diclofenac (VOLTAREN) 75 MG EC tablet; Take 1 tablet (75 mg total) 2 (two) times daily by mouth.  Dispense: 30 tablet; Refill: 0 - tizanidine (ZANAFLEX) 2 MG capsule; Take 1-2 capsules (2-4 mg total) 3 (three) times daily as needed by mouth for muscle spasms. Take 1 capsule daily before bed for 2-3 days, then may increase to 2 capsules.  Dispense: 45  capsule; Refill: 0  2. Large breasts - Ambulatory referral to Plastic Surgery  3. Pain in both feet - diclofenac (VOLTAREN) 75 MG EC tablet; Take 1 tablet (75 mg total) 2 (two) times daily by mouth.  Dispense: 30 tablet; Refill: 0  4. Morbid obesity due to excess calories (HCC) - Amb ref to Medical Nutrition Therapy-MNT - TSH - Comprehensive metabolic panel - Lifestyle modifications reinforced.  -Red flags and when to present for emergency care or RTC including fever >101.46F, chest pain, shortness of breath,numbness/tingling that doesn't go away, new/worsening/un-resolving symptoms, reviewed with patient at time of visit. Follow up and care instructions discussed and provided in AVS.

## 2017-09-21 LAB — COMPREHENSIVE METABOLIC PANEL
ALBUMIN: 4.3 g/dL (ref 3.5–5.5)
ALK PHOS: 61 IU/L (ref 39–117)
ALT: 8 IU/L (ref 0–32)
AST: 19 IU/L (ref 0–40)
Albumin/Globulin Ratio: 1.6 (ref 1.2–2.2)
BUN / CREAT RATIO: 10 (ref 9–23)
BUN: 7 mg/dL (ref 6–20)
CHLORIDE: 106 mmol/L (ref 96–106)
CO2: 17 mmol/L — ABNORMAL LOW (ref 20–29)
Calcium: 9.3 mg/dL (ref 8.7–10.2)
Creatinine, Ser: 0.71 mg/dL (ref 0.57–1.00)
GFR calc Af Amer: 138 mL/min/{1.73_m2} (ref >59)
GFR calc non Af Amer: 120 mL/min/{1.73_m2} (ref >59)
GLOBULIN, TOTAL: 2.7 g/dL (ref 1.5–4.5)
GLUCOSE: 81 mg/dL (ref 65–99)
Potassium: 4.3 mmol/L (ref 3.5–5.2)
SODIUM: 141 mmol/L (ref 134–144)
Total Protein: 7 g/dL (ref 6.0–8.5)

## 2017-09-21 LAB — SPECIMEN STATUS REPORT

## 2017-09-21 LAB — TSH: TSH: 2.04 u[IU]/mL (ref 0.450–4.500)

## 2017-10-21 ENCOUNTER — Encounter: Payer: Managed Care, Other (non HMO) | Admitting: Family Medicine

## 2017-10-24 ENCOUNTER — Ambulatory Visit: Payer: 59 | Admitting: Dietician

## 2017-11-08 ENCOUNTER — Ambulatory Visit: Payer: 59 | Admitting: Dietician

## 2017-11-27 ENCOUNTER — Emergency Department
Admission: EM | Admit: 2017-11-27 | Discharge: 2017-11-27 | Disposition: A | Discharge status: Home / Self Care | Payer: Commercial Managed Care - HMO | Attending: Emergency Medicine | Admitting: Emergency Medicine

## 2017-11-27 ENCOUNTER — Encounter: Payer: Self-pay | Admitting: Intensive Care

## 2017-11-27 DIAGNOSIS — R109 Unspecified abdominal pain: Secondary | ICD-10-CM | POA: Insufficient documentation

## 2017-11-27 DIAGNOSIS — R197 Diarrhea, unspecified: Secondary | ICD-10-CM | POA: Diagnosis not present

## 2017-11-27 LAB — CBC
HEMATOCRIT: 36.4 % (ref 35.0–47.0)
Hemoglobin: 11.6 g/dL — ABNORMAL LOW (ref 12.0–16.0)
MCH: 22.5 pg — AB (ref 26.0–34.0)
MCHC: 31.9 g/dL — AB (ref 32.0–36.0)
MCV: 70.4 fL — AB (ref 80.0–100.0)
Platelets: 429 10*3/uL (ref 150–440)
RBC: 5.18 MIL/uL (ref 3.80–5.20)
RDW: 19.2 % — ABNORMAL HIGH (ref 11.5–14.5)
WBC: 6.5 10*3/uL (ref 3.6–11.0)

## 2017-11-27 LAB — COMPREHENSIVE METABOLIC PANEL
ALBUMIN: 4.4 g/dL (ref 3.5–5.0)
ALK PHOS: 63 U/L (ref 38–126)
ALT: 10 U/L — AB (ref 14–54)
AST: 21 U/L (ref 15–41)
Anion gap: 7 (ref 5–15)
BUN: 8 mg/dL (ref 6–20)
CALCIUM: 9.4 mg/dL (ref 8.9–10.3)
CHLORIDE: 107 mmol/L (ref 101–111)
CO2: 23 mmol/L (ref 22–32)
Creatinine, Ser: 0.71 mg/dL (ref 0.44–1.00)
GFR calc Af Amer: >60 mL/min (ref >60)
GFR calc non Af Amer: >60 mL/min (ref >60)
GLUCOSE: 91 mg/dL (ref 65–99)
Potassium: 3.7 mmol/L (ref 3.5–5.1)
SODIUM: 137 mmol/L (ref 135–145)
Total Bilirubin: 0.6 mg/dL (ref 0.3–1.2)
Total Protein: 8.1 g/dL (ref 6.5–8.1)

## 2017-11-27 LAB — LIPASE, BLOOD: LIPASE: 26 U/L (ref 11–51)

## 2017-11-27 MED ORDER — ONDANSETRON 4 MG PO TBDP: 4.0000 mg | ORAL_TABLET | Freq: Three times a day (TID) | ORAL | 0 refills | Status: DC | PRN

## 2017-11-27 MED ORDER — SODIUM CHLORIDE 0.9 % IV SOLN
Freq: Once | INTRAVENOUS | Status: AC
  Administered 2017-11-27: 09:00:00 via INTRAVENOUS

## 2017-11-27 MED ORDER — ONDANSETRON 4 MG PO TBDP
4.0000 mg | ORAL_TABLET | Freq: Once | ORAL | Status: AC
  Administered 2017-11-27: 4 mg via ORAL
  Filled 2017-11-27: qty 1

## 2017-11-27 MED ORDER — ONDANSETRON HCL 4 MG/2ML IJ SOLN
4.0000 mg | Freq: Once | INTRAMUSCULAR | Status: AC
  Administered 2017-11-27: 4 mg via INTRAVENOUS
  Filled 2017-11-27: qty 2

## 2017-11-27 MED ORDER — DICYCLOMINE HCL 20 MG PO TABS
20.0000 mg | ORAL_TABLET | Freq: Once | ORAL | Status: AC
  Administered 2017-11-27: 20 mg via ORAL
  Filled 2017-11-27: qty 1

## 2017-11-27 MED ORDER — DICYCLOMINE HCL 20 MG PO TABS: 20.0000 mg | ORAL_TABLET | Freq: Three times a day (TID) | ORAL | 0 refills | Status: DC | PRN

## 2017-11-27 NOTE — ED Provider Notes (Signed)
[facility] Emergency Department Provider Note       Time seen: ----------------------------------------- 8:14 AM on 11/27/2017 -----------------------------------------   I have reviewed the triage vital signs and the nursing notes.  HISTORY   Chief Complaint Abdominal Pain    HPI [name] is a 27 y.o. female with a history of allergies, anemia who presents to the ED for abdominal pain with chills, nausea and diarrhea for almost a week.  She reports 3 episodes of diarrhea in the last 24 hours.  Discomfort is 8 out of 10 in the abdomen, nothing makes it better or worse.  She denies any specific sick contacts.  She denies flulike symptoms.  Past Medical History:  Diagnosis Date  . Allergy   . Hives   . Microcytic anemia 03/13/2016    Patient Active Problem List   Diagnosis Date Noted  . Urticaria 04/02/2016  . Microcytic anemia 03/13/2016  . Pain in both hands 03/13/2016  . Pain in both feet 03/13/2016  . Abnormal weight gain 03/13/2016    History reviewed. No pertinent surgical history.  Allergies Patient has no known allergies.  Social History Social History   Tobacco Use  . Smoking status: Never Smoker  . Smokeless tobacco: Never Used  Substance Use Topics  . Alcohol use: No    Alcohol/week: 0.0 oz  . Drug use: No    Review of Systems Constitutional: Negative for fever.  Positive for chills Eyes: Negative for vision changes ENT:  Negative for congestion, sore throat Cardiovascular: Negative for chest pain. Respiratory: Negative for shortness of breath. Gastrointestinal: Positive for abdominal pain, vomiting and diarrhea Musculoskeletal: Negative for back pain. Skin: Negative for rash. Neurological: Negative for headaches, focal weakness or numbness.  All systems negative/normal/unremarkable except as stated in the HPI  ____________________________________________   PHYSICAL EXAM:  VITAL SIGNS: ED Triage Vitals   Enc Vitals Group     BP 11/27/17 0806 122/72     Pulse Rate 11/27/17 0806 86     Resp 11/27/17 0806 18     Temp 11/27/17 0806 98.3 F (36.8 C)     Temp Source 11/27/17 0806 Oral     SpO2 11/27/17 0806 99 %     Weight 11/27/17 0809 230 lb (104.3 kg)     Height 11/27/17 0809 5\' 4"  (1.626 m)     Head Circumference --      Peak Flow --      Pain Score 11/27/17 0808 8     Pain Loc --      Pain Edu? --      Excl. in GC? --     Constitutional: Alert and oriented. Well appearing and in no distress. Eyes: Conjunctivae are normal. Normal extraocular movements. ENT   Head: Normocephalic and atraumatic.   Nose: No congestion/rhinnorhea.   Mouth/Throat: Mucous membranes are moist.   Neck: No stridor. Cardiovascular: Normal rate, regular rhythm. No murmurs, rubs, or gallops. Respiratory: Normal respiratory effort without tachypnea nor retractions. Breath sounds are clear and equal bilaterally. No wheezes/rales/rhonchi. Gastrointestinal: Soft and nontender. Normal bowel sounds Musculoskeletal: Nontender with normal range of motion in extremities. No lower extremity tenderness nor edema. Neurologic:  Normal speech and language. No gross focal neurologic deficits are appreciated.  Skin:  Skin is warm, dry and intact. No rash noted. Psychiatric: Mood and affect are normal. Speech and behavior are normal.  ___________________________________________  ED COURSE:  As part of my medical decision making, I reviewed the following data within the  electronic MEDICAL RECORD NUMBER History obtained from family if available, nursing notes, old chart and ekg, as well as notes from prior ED visits. Patient presented for abdominal pain and diarrhea, we will assess with labs and imaging as indicated at this time.   Procedures ____________________________________________   LABS (pertinent positives/negatives)  Labs Reviewed  COMPREHENSIVE METABOLIC PANEL - Abnormal; Notable for the following  components:      Result Value   ALT 10 (*)    All other components within normal limits  CBC - Abnormal; Notable for the following components:   Hemoglobin 11.6 (*)    MCV 70.4 (*)    MCH 22.5 (*)    MCHC 31.9 (*)    RDW 19.2 (*)    All other components within normal limits  LIPASE, BLOOD  URINALYSIS, COMPLETE (UACMP) WITH MICROSCOPIC  ____________________________________________  DIFFERENTIAL DIAGNOSIS   Gastroenteritis, influenza, irritable bowel syndrome, inflammatory bowel disorder, dehydration, electrolyte abnormality  FINAL ASSESSMENT AND PLAN  Abdominal pain, diarrhea   Plan: Patient had presented for abdominal pain and diarrhea. Patient's labs were reassuring.  Patient did not require any imaging.  She will be discharged with Zofran and Bentyl.   Emily FilbertWilliams, Jonathan E, MD   Note: This note was generated in part or whole with voice recognition software. Voice recognition is usually quite accurate but there are transcription errors that can and very often do occur. I apologize for any typographical errors that were not detected and corrected.     Emily FilbertWilliams, Jonathan E, MD 11/27/17 1038

## 2017-11-27 NOTE — ED Triage Notes (Signed)
Patient c/o diarrhea, chills/sweats, and nausea with abdominal pain for almost a week. Denies urinary symptoms. Reports 3 episodes diarrhea in 24 hours. No distress noted in triage

## 2018-01-03 ENCOUNTER — Emergency Department (HOSPITAL_COMMUNITY)
Admission: EM | Admit: 2018-01-03 | Discharge: 2018-01-03 | Disposition: A | Discharge status: Home / Self Care | Payer: Worker's Compensation | Attending: Emergency Medicine | Admitting: Emergency Medicine

## 2018-01-03 ENCOUNTER — Encounter (HOSPITAL_COMMUNITY): Payer: Self-pay

## 2018-01-03 ENCOUNTER — Other Ambulatory Visit: Payer: Self-pay

## 2018-01-03 ENCOUNTER — Emergency Department (HOSPITAL_COMMUNITY): Payer: Worker's Compensation

## 2018-01-03 DIAGNOSIS — Y929 Unspecified place or not applicable: Secondary | ICD-10-CM | POA: Diagnosis not present

## 2018-01-03 DIAGNOSIS — W010XXA Fall on same level from slipping, tripping and stumbling without subsequent striking against object, initial encounter: Secondary | ICD-10-CM | POA: Diagnosis not present

## 2018-01-03 DIAGNOSIS — Y990 Civilian activity done for income or pay: Secondary | ICD-10-CM | POA: Insufficient documentation

## 2018-01-03 DIAGNOSIS — Y939 Activity, unspecified: Secondary | ICD-10-CM | POA: Insufficient documentation

## 2018-01-03 DIAGNOSIS — M25531 Pain in right wrist: Secondary | ICD-10-CM | POA: Insufficient documentation

## 2018-01-03 DIAGNOSIS — S6991XA Unspecified injury of right wrist, hand and finger(s), initial encounter: Secondary | ICD-10-CM | POA: Diagnosis present

## 2018-01-03 MED ORDER — ACETAMINOPHEN 325 MG PO TABS: 650.0000 mg | ORAL_TABLET | Freq: Four times a day (QID) | ORAL | 0 refills | Status: DC | PRN

## 2018-01-03 MED ORDER — IBUPROFEN 400 MG PO TABS: 400.0000 mg | ORAL_TABLET | Freq: Four times a day (QID) | ORAL | 0 refills | Status: DC | PRN

## 2018-01-03 NOTE — Discharge Instructions (Signed)
Please wear the splint at all times unless you are showering until you you are seen again and have had a repeat imaging to rule out an occult fracture in your right wrist.  Please return to either your primary care doctor, urgent care, ER, or the orthopedic office to have this follow-up imaging completed within 7-10 days.  You may take Tylenol and ibuprofen to manage your pain.  Please follow-up in the emergency department if you have any new or worsening symptoms including any weakness in your hand, numbness, worsening pain or swelling, or any new or worsening symptoms.

## 2018-01-03 NOTE — ED Triage Notes (Signed)
Patient slipped on a wet floor at work at [id number] today. Patient c/o right wrist  Pain and a bruise on her right leg.

## 2018-01-03 NOTE — ED Provider Notes (Signed)
[facility]-EMERGENCY DEPT Provider Note   CSN: [id number] Arrival date & time: 01/03/18  1444     History   Chief Complaint Chief Complaint  Patient presents with  . Fall  . Wrist Pain    HPI [name] is a 27 y.o. female.  HPI   Patient is a 27 year old female who presents the ED today complaining of right wrist pain that began after she slipped and fell on her outstretched hand on a wet floor at work today.  Patient reports a tingling sensation to the left hand as well as pain across the dorsal aspect of her wrist.  Pain is worse with movement.  She also reports associated swelling.  Rates pain 10/10.  Pain is constant and worse with movement.  She has not taken anything for her symptoms.  She also reports pain to the left lateral portion of her right leg.  States that there is some swelling to the left leg.  Denies any difficulty ambulating, numbness, weakness to the bilateral lower extremities.  Denies any neck or back pain.  Denies that she hit her head or loss consciousness.  Denies any headaches.  Past Medical History:  Diagnosis Date  . Allergy   . Hives   . Microcytic anemia 03/13/2016    Patient Active Problem List   Diagnosis Date Noted  . Urticaria 04/02/2016  . Microcytic anemia 03/13/2016  . Pain in both hands 03/13/2016  . Pain in both feet 03/13/2016  . Abnormal weight gain 03/13/2016    Past Surgical History:  Procedure Laterality Date  . WISDOM TOOTH EXTRACTION      OB History    No data available       Home Medications    Prior to Admission medications   Medication Sig Start Date End Date Taking? Authorizing Provider  diclofenac (VOLTAREN) 75 MG EC tablet Take 1 tablet (75 mg total) 2 (two) times daily by mouth. Patient not taking: Reported on 11/27/2017 09/20/17   Doren Custard, FNP  dicyclomine (BENTYL) 20 MG tablet Take 1 tablet (20 mg total) by mouth 3 (three) times daily as needed for spasms. 11/27/17    Emily Filbert, MD  ondansetron (ZOFRAN ODT) 4 MG disintegrating tablet Take 1 tablet (4 mg total) by mouth every 8 (eight) hours as needed for nausea or vomiting. 11/27/17   Emily Filbert, MD  tizanidine (ZANAFLEX) 2 MG capsule Take 1-2 capsules (2-4 mg total) 3 (three) times daily as needed by mouth for muscle spasms. Take 1 capsule daily before bed for 2-3 days, then may increase to 2 capsules. Patient not taking: Reported on 11/27/2017 09/20/17   Doren Custard, FNP    Family History Family History  Problem Relation Age of Onset  . Diabetes Father   . Diabetes Maternal Grandfather   . Cancer Maternal Grandfather        lung    Social History Social History   Tobacco Use  . Smoking status: Never Smoker  . Smokeless tobacco: Never Used  Substance Use Topics  . Alcohol use: No    Alcohol/week: 0.0 oz  . Drug use: No     Allergies   Patient has no known allergies.   Review of Systems Review of Systems  Constitutional:       Fall  Respiratory: Negative for shortness of breath.   Cardiovascular: Negative for chest pain.  Musculoskeletal: Negative for back pain and neck pain.  Right lateral leg pain, right wrist pain  Neurological: Negative for headaches.       Paresthesias to right hand, no head trauma or loc     Physical Exam Updated Vital Signs BP 101/84 (BP Location: Left Arm)   Temp 98.9 F (37.2 C) (Oral)   Resp 20   Ht 5\' 4"  (1.626 m)   Wt 97.5 kg (215 lb)   LMP 12/20/2017   SpO2 100%   BMI 36.90 kg/m   Physical Exam  Constitutional: She appears well-developed and well-nourished. No distress.  HENT:  Head: Normocephalic and atraumatic.  Eyes: Conjunctivae are normal.  Neck: Neck supple.  Cardiovascular: Normal rate.  Pulmonary/Chest: Effort normal.  Musculoskeletal: Normal range of motion.  No ttp to the carpals. No ttp to distal radius/ulna. mild TTP across the 4th metacarpal and proximal thumb with no obvous stepoff or  deformity, as well as snuff box ttp on right. Pt has sensation to all 5 fingers with brisk cap refill. Reports subjective difference in sensation to 4th and 5th fingers on right hand, however has sensation to dull/sharp touch. Throughout. 5/5 flexion/ext of wrist bilat. 5/5 grip strength bilat. Decreased pincer strength on right, 4/5. Good strength with finger abduction bilat. Mild ttp with and area of swelling about 8cmx4cm on right lateral thigh. No ecchymosis noted. Normal ambulation.  Neurological: She is alert.  Skin: Skin is warm and dry.  Psychiatric: She has a normal mood and affect.  Nursing note and vitals reviewed.    ED Treatments / Results  Labs (all labs ordered are listed, but only abnormal results are displayed) Labs Reviewed - No data to display  EKG  EKG Interpretation None       Radiology Dg Wrist Complete Right  Result Date: 01/03/2018 CLINICAL DATA:  Fall today with right wrist pain. EXAM: RIGHT WRIST - COMPLETE 3+ VIEW COMPARISON:  None. FINDINGS: There is no evidence of fracture or dislocation. There is no evidence of arthropathy or other focal bone abnormality. Soft tissues are unremarkable. IMPRESSION: Negative. Electronically Signed   By: Irish Lack M.D.   On: 01/03/2018 17:22    Procedures Procedures (including critical care time) SPLINT APPLICATION Date/Time: 6:37 PM Authorized by: Karrie Meres Consent: Verbal consent obtained. Risks and benefits: risks, benefits and alternatives were discussed Consent given by: patient Splint applied by: orthopedic technician Location details: right wrist Splint type: thumb spica Supplies used: thumb spica Post-procedure: The splinted body part was neurovascularly unchanged following the procedure. Patient tolerance: Patient tolerated the procedure well with no immediate complications.  Medications Ordered in ED Medications - No data to display   Initial Impression / Assessment and Plan / ED Course  I  have reviewed the triage vital signs and the nursing notes.  Pertinent labs & imaging results that were available during my care of the patient were reviewed by me and considered in my medical decision making (see chart for details).      Final Clinical Impressions(s) / ED Diagnoses   Final diagnoses:  None   27 year old female presents the ED today status post fall that occurred at work earlier today.  Has pain to the right wrist.  Has tenderness to the fourth metacarpal as well as proximal thumb and snuffbox.  X-ray of right wrist negative for fracture or dislocation, however given snuffbox tenderness will apply thumb spica and have patient follow-up in 7-10 days for repeat x-ray to rule out occult fracture.  Advised her to follow-up with either primary care,  urgent care, or here for repeat imaging.  We will also give [facility] follow-[facility] should she need to follow-up with them.  Will give anti-inflammatories for pain.  Discussed return precautions and patient and her wife understand the plan and reasons to return.  All questions were answered.  ED Discharge Orders    None       Rayne DuCouture, Zailynn Brandel S, PA-C 01/03/18 1840    Shaune PollackIsaacs, Cameron, MD 01/04/18 [phone]

## 2018-02-13 ENCOUNTER — Encounter: Payer: Self-pay | Admitting: Family Medicine

## 2018-04-17 ENCOUNTER — Encounter: Payer: Self-pay | Admitting: Family Medicine

## 2018-05-22 ENCOUNTER — Encounter: Payer: Self-pay | Admitting: Family Medicine

## 2018-06-23 ENCOUNTER — Encounter: Payer: Self-pay | Admitting: Family Medicine

## 2019-02-25 ENCOUNTER — Emergency Department
Admission: EM | Admit: 2019-02-25 | Discharge: 2019-02-25 | Disposition: A | Discharge status: Home / Self Care | Payer: Self-pay | Attending: Student in an Organized Health Care Education/Training Program | Admitting: Student in an Organized Health Care Education/Training Program

## 2019-02-25 ENCOUNTER — Other Ambulatory Visit: Payer: Self-pay

## 2019-02-25 ENCOUNTER — Emergency Department: Payer: Self-pay

## 2019-02-25 DIAGNOSIS — R197 Diarrhea, unspecified: Secondary | ICD-10-CM | POA: Insufficient documentation

## 2019-02-25 DIAGNOSIS — R05 Cough: Secondary | ICD-10-CM | POA: Insufficient documentation

## 2019-02-25 DIAGNOSIS — R0600 Dyspnea, unspecified: Secondary | ICD-10-CM | POA: Insufficient documentation

## 2019-02-25 DIAGNOSIS — Z20828 Contact with and (suspected) exposure to other viral communicable diseases: Secondary | ICD-10-CM | POA: Insufficient documentation

## 2019-02-25 DIAGNOSIS — R059 Cough, unspecified: Secondary | ICD-10-CM

## 2019-02-25 HISTORY — DX: Unspecified asthma, uncomplicated: J45.909

## 2019-02-25 LAB — COMPREHENSIVE METABOLIC PANEL
ALT: 9 U/L (ref 0–44)
AST: 15 U/L (ref 15–41)
Albumin: 3.8 g/dL (ref 3.5–5.0)
Alkaline Phosphatase: 72 U/L (ref 38–126)
Anion gap: 7 (ref 5–15)
BUN: 8 mg/dL (ref 6–20)
CO2: 24 mmol/L (ref 22–32)
Calcium: 9.1 mg/dL (ref 8.9–10.3)
Chloride: 107 mmol/L (ref 98–111)
Creatinine, Ser: 0.65 mg/dL (ref 0.44–1.00)
GFR calc Af Amer: >60 mL/min (ref >60)
GFR calc non Af Amer: >60 mL/min (ref >60)
Glucose, Bld: 92 mg/dL (ref 70–99)
Potassium: 3.7 mmol/L (ref 3.5–5.1)
Sodium: 138 mmol/L (ref 135–145)
Total Bilirubin: 0.4 mg/dL (ref 0.3–1.2)
Total Protein: 7.9 g/dL (ref 6.5–8.1)

## 2019-02-25 LAB — CBC WITH DIFFERENTIAL/PLATELET
Abs Immature Granulocytes: 0.04 10*3/uL (ref 0.00–0.07)
Basophils Absolute: 0 10*3/uL (ref 0.0–0.1)
Basophils Relative: 0 %
Eosinophils Absolute: 0 10*3/uL (ref 0.0–0.5)
Eosinophils Relative: 0 %
HCT: 33.6 % — ABNORMAL LOW (ref 36.0–46.0)
Hemoglobin: 10 g/dL — ABNORMAL LOW (ref 12.0–15.0)
Immature Granulocytes: 1 %
Lymphocytes Relative: 36 %
Lymphs Abs: 2.8 10*3/uL (ref 0.7–4.0)
MCH: 21.5 pg — ABNORMAL LOW (ref 26.0–34.0)
MCHC: 29.8 g/dL — ABNORMAL LOW (ref 30.0–36.0)
MCV: 72.1 fL — ABNORMAL LOW (ref 80.0–100.0)
Monocytes Absolute: 0.6 10*3/uL (ref 0.1–1.0)
Monocytes Relative: 8 %
Neutro Abs: 4.2 10*3/uL (ref 1.7–7.7)
Neutrophils Relative %: 55 %
Platelets: 541 10*3/uL — ABNORMAL HIGH (ref 150–400)
RBC: 4.66 MIL/uL (ref 3.87–5.11)
RDW: 18.6 % — ABNORMAL HIGH (ref 11.5–15.5)
WBC: 7.7 10*3/uL (ref 4.0–10.5)
nRBC: 0 % (ref 0.0–0.2)

## 2019-02-25 LAB — URINALYSIS, COMPLETE (UACMP) WITH MICROSCOPIC
Bacteria, UA: NONE SEEN
Bilirubin Urine: NEGATIVE
Glucose, UA: NEGATIVE mg/dL
Ketones, ur: NEGATIVE mg/dL
Nitrite: NEGATIVE
Protein, ur: NEGATIVE mg/dL
Specific Gravity, Urine: 1.009 (ref 1.005–1.030)
pH: 6 (ref 5.0–8.0)

## 2019-02-25 LAB — SARS CORONAVIRUS 2 BY RT PCR (HOSPITAL ORDER, PERFORMED IN ~~LOC~~ HOSPITAL LAB): SARS Coronavirus 2: NEGATIVE

## 2019-02-25 LAB — LIPASE, BLOOD: Lipase: 21 U/L (ref 11–51)

## 2019-02-25 LAB — POCT PREGNANCY, URINE: Preg Test, Ur: NEGATIVE

## 2019-02-25 MED ORDER — ALBUTEROL SULFATE HFA 108 (90 BASE) MCG/ACT IN AERS: 2.0000 | INHALATION_SPRAY | Freq: Four times a day (QID) | RESPIRATORY_TRACT | 2 refills | Status: DC | PRN

## 2019-02-25 NOTE — ED Notes (Signed)
Patient not able to provide a stool sample at this time.

## 2019-02-25 NOTE — ED Provider Notes (Addendum)
Patient received in signout from Dr. Darnelle Catalan pending blood work and reassessment.  She remains hemodynamically stable.  COVID-19 test is negative.  No signs of consolidation.  Blood work is reassuring.  States she does have a remote history of asthma bronchitis and get seasonal allergies.  Does have some faint wheezing on exam therefore will prescribe albuterol.  She does not have any hypoxia or indication for admission to the hospital at this time.  Had any diarrhea or loose bowel movements today.  Do not feel that she needs to wait in the hospital or ER for stool studies.   Willy Eddy, MD Mar 08, 2019 1724    Willy Eddy, MD Mar 08, 2019 1725

## 2019-02-25 NOTE — ED Triage Notes (Signed)
Pt c/o cough with congrestion and SOB for the past 3-4 days. Denies fever, has a hx of asthma, pt is in NAD on arrival. Pt ambulatory

## 2019-02-25 NOTE — ED Notes (Signed)
Patient verbalizes understanding of discharge instructions and prescriptions. Ambulatory at discharge, VSS.

## 2019-02-25 NOTE — ED Provider Notes (Signed)
[facility] Emergency Department Provider Note   ____________________________________________   First MD Initiated Contact with Patient 02/25/19 1419     (approximate)  I have reviewed the triage vital signs and the nursing notes.   HISTORY  Chief Complaint URI    [name] is a 28 y.o. female who complains of 2 to 4 days of cough, congestion, shortness of breath, chills and diarrhea.  She has no fever.  She also complains of some achy belly pain in the lower abdomen.  It is worse on the left side.  Moderate pain.         Past Medical History:  Diagnosis Date  . Allergy   . Asthma   . Hives   . Microcytic anemia 03/13/2016    Patient Active Problem List   Diagnosis Date Noted  . Urticaria 04/02/2016  . Microcytic anemia 03/13/2016  . Pain in both hands 03/13/2016  . Pain in both feet 03/13/2016  . Abnormal weight gain 03/13/2016    Past Surgical History:  Procedure Laterality Date  . WISDOM TOOTH EXTRACTION      Prior to Admission medications   Medication Sig Start Date End Date Taking? Authorizing Provider  acetaminophen (TYLENOL) 325 MG tablet Take 2 tablets (650 mg total) by mouth every 6 (six) hours as needed. Do not take more than 4000mg  of tylenol per day 01/03/18   Couture, Cortni S, PA-C  diclofenac (VOLTAREN) 75 MG EC tablet Take 1 tablet (75 mg total) 2 (two) times daily by mouth. Patient not taking: Reported on 11/27/2017 09/20/17   Doren Custard, FNP  dicyclomine (BENTYL) 20 MG tablet Take 1 tablet (20 mg total) by mouth 3 (three) times daily as needed for spasms. 11/27/17   Emily Filbert, MD  ibuprofen (ADVIL,MOTRIN) 400 MG tablet Take 1 tablet (400 mg total) by mouth every 6 (six) hours as needed. 01/03/18   Couture, Cortni S, PA-C  ondansetron (ZOFRAN ODT) 4 MG disintegrating tablet Take 1 tablet (4 mg total) by mouth every 8 (eight) hours as needed for nausea or vomiting. 11/27/17   Emily Filbert, MD   tizanidine (ZANAFLEX) 2 MG capsule Take 1-2 capsules (2-4 mg total) 3 (three) times daily as needed by mouth for muscle spasms. Take 1 capsule daily before bed for 2-3 days, then may increase to 2 capsules. Patient not taking: Reported on 11/27/2017 09/20/17   Doren Custard, FNP    Allergies Patient has no known allergies.  Family History  Problem Relation Age of Onset  . Diabetes Father   . Diabetes Maternal Grandfather   . Cancer Maternal Grandfather        lung    Social History Social History   Tobacco Use  . Smoking status: Never Smoker  . Smokeless tobacco: Never Used  Substance Use Topics  . Alcohol use: No    Alcohol/week: 0.0 standard drinks  . Drug use: No    Review of Systems  Constitutional: No fever does have chills Eyes: No visual changes. ENT: No sore throat. Cardiovascular: Denies chest pain. Respiratory: Denies shortness of breath. Gastrointestinal:  abdominal pain.  No nausea, no vomiting.  diarrhea.  No constipation. Genitourinary: Negative for dysuria. Musculoskeletal: Negative for back pain. Skin: Negative for rash. Neurological: Negative for headaches, focal weakness  ____________________________________________   PHYSICAL EXAM:  VITAL SIGNS: ED Triage Vitals  Enc Vitals Group     BP 02/25/19 1407 104/85     Pulse Rate 02/25/19  1407 100     Resp 02/25/19 1407 18     Temp 02/25/19 1407 98.5 F (36.9 C)     Temp Source 02/25/19 1407 Oral     SpO2 02/25/19 1407 100 %     Weight 02/25/19 1409 220 lb (99.8 kg)     Height 02/25/19 1409 5\' 4"  (1.626 m)     Head Circumference --      Peak Flow --      Pain Score 02/25/19 1409 3     Pain Loc --      Pain Edu? --      Excl. in GC? --     Constitutional: Alert and oriented. Well appearing and in no acute distress. Eyes: Conjunctivae are normal.  Head: Atraumatic. Nose: No congestion/rhinnorhea. Mouth/Throat: Mucous membranes are moist.  Oropharynx non-erythematous. Neck: No stridor.   Cardiovascular: Normal rate, regular rhythm. Grossly normal heart sounds.  Good peripheral circulation. Respiratory: Normal respiratory effort.  No retractions. Lungs CTAB. Gastrointestinal: Soft slightly tender to palpation percussion of the lower abdomen especially left lower quadrant.  No distention. No abdominal bruits. No CVA tenderness. Musculoskeletal: No lower extremity tenderness nor edema.  Neurologic:  Normal speech and language. No gross focal neurologic deficits are appreciated.  Skin:  Skin is warm, dry and intact. No rash noted. .  ____________________________________________   LABS (all labs ordered are listed, but only abnormal results are displayed)  Labs Reviewed  CBC WITH DIFFERENTIAL/PLATELET - Abnormal; Notable for the following components:      Result Value   Hemoglobin 10.0 (*)    HCT 33.6 (*)    MCV 72.1 (*)    MCH 21.5 (*)    MCHC 29.8 (*)    RDW 18.6 (*)    Platelets 541 (*)    All other components within normal limits  SARS CORONAVIRUS 2 (HOSPITAL ORDER, PERFORMED IN [facility])  GASTROINTESTINAL PANEL BY PCR, STOOL (REPLACES STOOL CULTURE)  C DIFFICILE QUICK SCREEN W PCR REFLEX  COMPREHENSIVE METABOLIC PANEL  LIPASE, BLOOD  URINALYSIS, COMPLETE (UACMP) WITH MICROSCOPIC  CBC WITH DIFFERENTIAL/PLATELET  POC URINE PREG, ED  POCT PREGNANCY, URINE   ____________________________________________  EKG   ____________________________________________  RADIOLOGY  ED MD interpretation:    Official radiology report(s): Dg Chest Port 1 View  Result Date: 02/25/2019 CLINICAL DATA:  28 year old female with a history of cough and shortness of breath EXAM: PORTABLE CHEST 1 VIEW COMPARISON:  07/24/2015 FINDINGS: Cardiomediastinal silhouette unchanged in size and contour. No pneumothorax or pleural effusion. No confluent airspace disease. No displaced fracture. IMPRESSION: Negative for acute cardiopulmonary disease Electronically Signed   By:  Gilmer MorJaime  Wagner D.O.   On: 02/25/2019 14:51    ____________________________________________   PROCEDURES  Procedure(s) performed (including Critical Care):  Procedures   ____________________________________________   INITIAL IMPRESSION / ASSESSMENT AND PLAN / ED COURSE  Patient with cough shortness of breath diarrhea.  Labs are pending.  COVID test is pending.  Patient could have a COVID infection we will treat her as such until we know.  Thankfully we do have the rapid test here now.  I signed the patient out to Dr. Roxan Hockeyobinson.             ____________________________________________   FINAL CLINICAL IMPRESSION(S) / ED DIAGNOSES  Final diagnoses:  Cough  Diarrhea, unspecified type  Dyspnea, unspecified type     ED Discharge Orders    None       Note:  This document was prepared  using Conservation officer, historic buildings and may include unintentional dictation errors.    Arnaldo Natal, MD 02/25/19 [phone]

## 2019-02-25 NOTE — ED Notes (Signed)
Pt presents to ED via POV with c/o cough, SOB, and diarrhea. Pt c/o LLQ abdominal pain with palpation. Pt denies fevers, c/o chills. Pt is alert and oriented at this time. NAD noted. Skin warm, dry, and intact, respirations even and unlabored, A&O x 4.

## 2019-05-14 ENCOUNTER — Other Ambulatory Visit: Payer: Self-pay | Admitting: Family Medicine

## 2019-05-14 DIAGNOSIS — Z20822 Contact with and (suspected) exposure to covid-19: Secondary | ICD-10-CM

## 2019-05-19 LAB — NOVEL CORONAVIRUS, NAA: SARS-CoV-2, NAA: NOT DETECTED

## 2019-09-27 IMAGING — DX PORTABLE CHEST - 1 VIEW
1 series · 1 of 1 positions shown · non-contrast
Comparison: 07/24/2015

CLINICAL DATA: 27-year-old female with a history of cough and
shortness of breath

EXAM:
PORTABLE CHEST 1 VIEW

[chest ap]
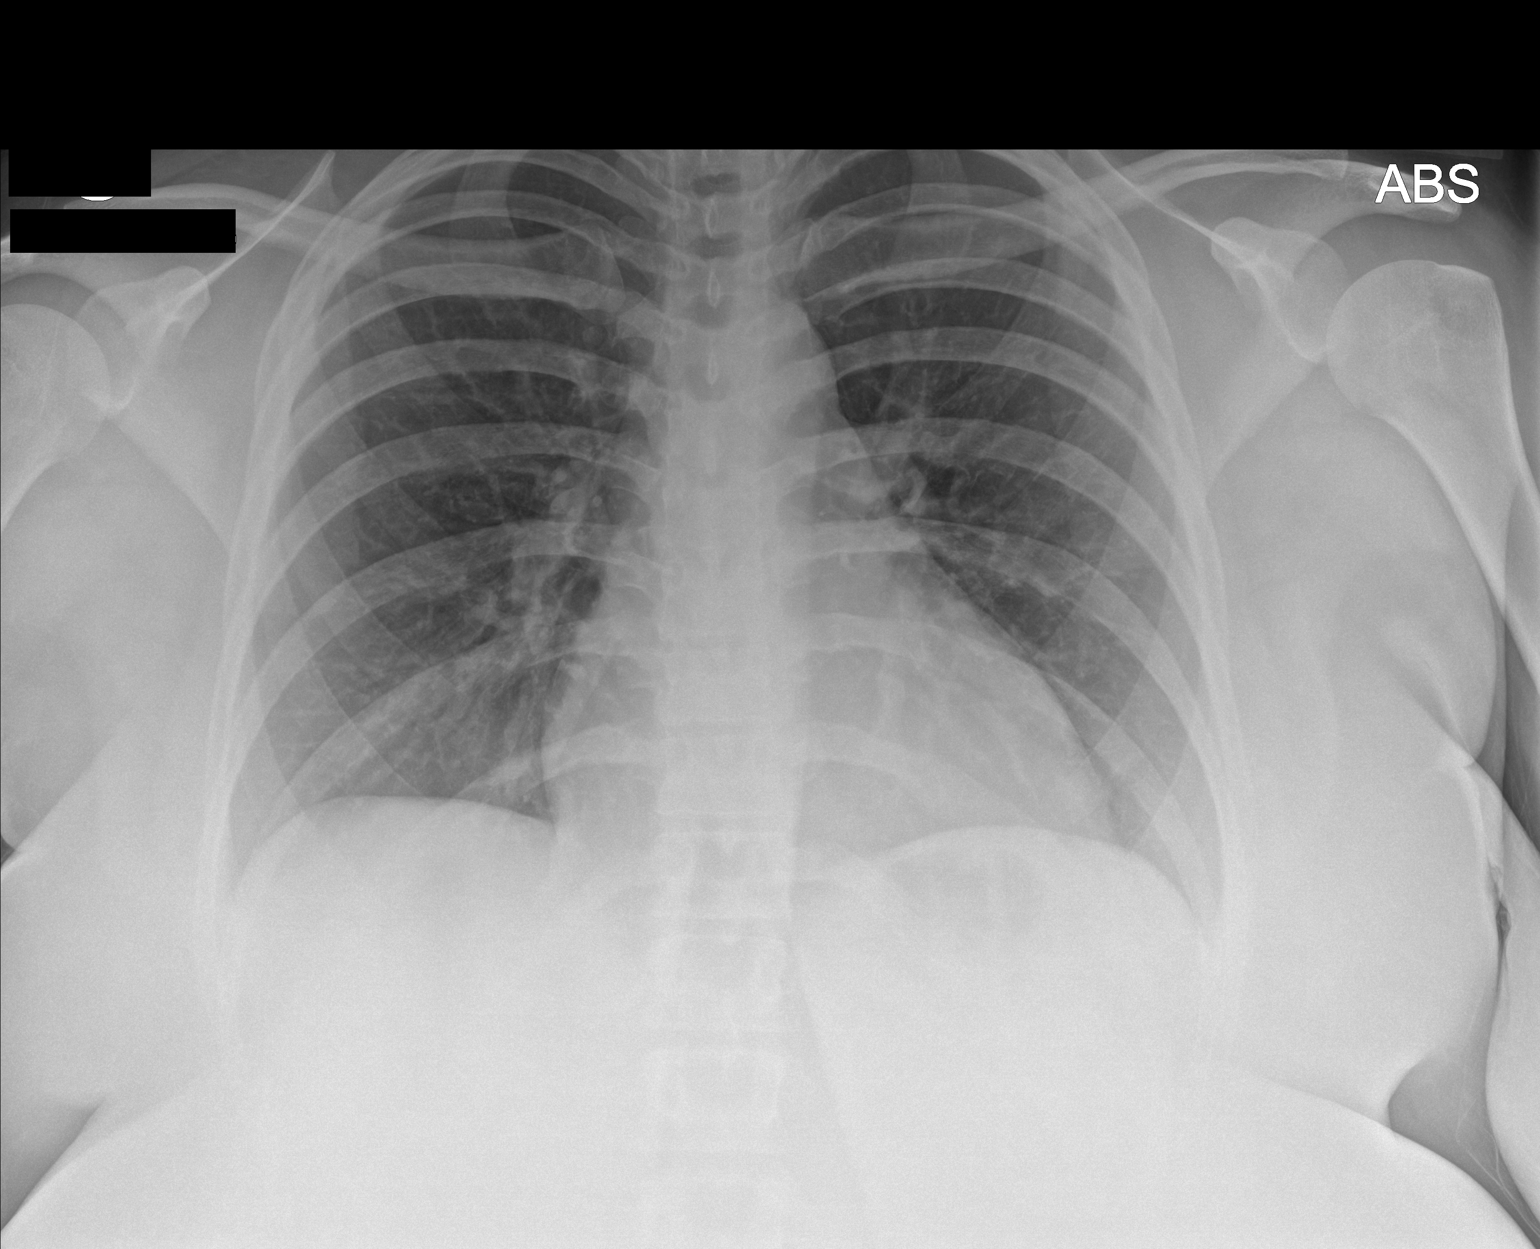

[1 of 1 positions shown; findings below may reference images not displayed]

FINDINGS: Cardiomediastinal silhouette unchanged in size and contour. No
pneumothorax or pleural effusion. No confluent airspace disease. No
displaced fracture.
IMPRESSION: Negative for acute cardiopulmonary disease

## 2019-11-23 ENCOUNTER — Ambulatory Visit: Payer: BC Managed Care – PPO | Attending: Internal Medicine

## 2019-11-23 DIAGNOSIS — Z20822 Contact with and (suspected) exposure to covid-19: Secondary | ICD-10-CM

## 2019-11-23 DIAGNOSIS — U071 COVID-19: Secondary | ICD-10-CM | POA: Insufficient documentation

## 2019-11-24 LAB — NOVEL CORONAVIRUS, NAA: SARS-CoV-2, NAA: DETECTED — AB

## 2019-11-25 ENCOUNTER — Telehealth: Payer: Self-pay | Admitting: Nurse Practitioner

## 2019-11-25 NOTE — Telephone Encounter (Signed)
Called to Discuss with patient about Covid symptoms and the use of bamlanivimab, a monoclonal antibody infusion for those with mild to moderate Covid symptoms and at a high risk of hospitalization.     Pt is qualified for this infusion at the [facility] infusion center due to co-morbid conditions and/or a member of an at-risk group.     Patient's BMI is over 35  Patient Active Problem List   Diagnosis Date Noted  . Urticaria 04/02/2016  . Microcytic anemia 03/13/2016  . Pain in both hands 03/13/2016  . Pain in both feet 03/13/2016  . Abnormal weight gain 03/13/2016    Patient declines infusion at this time. Symptoms tier reviewed as well as criteria for ending isolation. Preventative practices reviewed. Patient verbalized understanding.     Patient advised to go to Urgent care or ED with severe symptoms.

## 2019-12-04 ENCOUNTER — Ambulatory Visit: Payer: BC Managed Care – PPO | Attending: Internal Medicine

## 2019-12-04 DIAGNOSIS — Z20822 Contact with and (suspected) exposure to covid-19: Secondary | ICD-10-CM | POA: Insufficient documentation

## 2019-12-05 LAB — NOVEL CORONAVIRUS, NAA: SARS-CoV-2, NAA: NOT DETECTED

## 2019-12-15 ENCOUNTER — Other Ambulatory Visit: Payer: Self-pay

## 2019-12-15 ENCOUNTER — Encounter: Payer: Self-pay | Admitting: Family Medicine

## 2019-12-15 ENCOUNTER — Ambulatory Visit (INDEPENDENT_AMBULATORY_CARE_PROVIDER_SITE_OTHER): Payer: Self-pay | Admitting: Family Medicine

## 2019-12-15 DIAGNOSIS — R058 Other specified cough: Secondary | ICD-10-CM

## 2019-12-15 DIAGNOSIS — Z8709 Personal history of other diseases of the respiratory system: Secondary | ICD-10-CM

## 2019-12-15 DIAGNOSIS — Z8616 Personal history of COVID-19: Secondary | ICD-10-CM

## 2019-12-15 DIAGNOSIS — R05 Cough: Secondary | ICD-10-CM

## 2019-12-15 MED ORDER — BENZONATATE 100 MG PO CAPS: 100.0000 mg | ORAL_CAPSULE | Freq: Three times a day (TID) | ORAL | 0 refills | Status: DC | PRN

## 2019-12-15 MED ORDER — AZITHROMYCIN 250 MG PO TABS: ORAL_TABLET | ORAL | 0 refills | Status: DC

## 2019-12-15 MED ORDER — PREDNISONE 10 MG PO TABS: ORAL_TABLET | ORAL | 0 refills | Status: DC

## 2019-12-15 MED ORDER — ALBUTEROL SULFATE HFA 108 (90 BASE) MCG/ACT IN AERS: 2.0000 | INHALATION_SPRAY | Freq: Four times a day (QID) | RESPIRATORY_TRACT | 1 refills | Status: AC | PRN

## 2019-12-15 NOTE — Progress Notes (Signed)
Name: [name]   MRN: [id number]    DOB: 1990-12-31   Date:12/15/2019       Progress Note  Subjective  Chief Complaint  Chief Complaint  Patient presents with  . Covid Positive    tested positive on 1/18  . Cough    congestion, headache     I connected with  Nash Mantis  on 12/15/19 at 11:40 AM EST by a video enabled telemedicine application and verified that I am speaking with the correct person using two identifiers.  I discussed the limitations of evaluation and management by telemedicine and the availability of in person appointments. The patient expressed understanding and agreed to proceed. Staff also discussed with the patient that there may be a patient responsible charge related to this service. Patient Location: Home Provider Location: Home Office Additional Individuals present: None  HPI  Pt presents for concern for COVID-19 infection.  She tested positive on 11/23/19, symptoms started 11/19/19. She declined bamlanivimab infusion which was offered to her due to her BMI of 35.  She still has productive cough (yellow/green phlegm), nasal congestion, chest congestion; taste and smell just barely starting to come back, appetite is slowly recovering.  Still having fatigue.  She is up at night trying to cough and this causes some shortness of breath.  Denies chest pain, fevers/chills, no vomiting; occasional diarrhea.   Patient Active Problem List   Diagnosis Date Noted  . Urticaria 04/02/2016  . Microcytic anemia 03/13/2016  . Pain in both hands 03/13/2016  . Pain in both feet 03/13/2016  . Abnormal weight gain 03/13/2016    Social History   Tobacco Use  . Smoking status: Never Smoker  . Smokeless tobacco: Never Used  Substance Use Topics  . Alcohol use: No    Alcohol/week: 0.0 standard drinks     Current Outpatient Medications:  .  acetaminophen (TYLENOL) 325 MG tablet, Take 2 tablets (650 mg total) by mouth every 6 (six) hours as needed. Do not take  more than 4000mg  of tylenol per day, Disp: 30 tablet, Rfl: 0 .  ibuprofen (ADVIL,MOTRIN) 400 MG tablet, Take 1 tablet (400 mg total) by mouth every 6 (six) hours as needed., Disp: 30 tablet, Rfl: 0 .  albuterol (VENTOLIN HFA) 108 (90 Base) MCG/ACT inhaler, Inhale 2 puffs into the lungs every 6 (six) hours as needed for wheezing or shortness of breath. (Patient not taking: Reported on 12/15/2019), Disp: 1 Inhaler, Rfl: 2 .  diclofenac (VOLTAREN) 75 MG EC tablet, Take 1 tablet (75 mg total) 2 (two) times daily by mouth. (Patient not taking: Reported on 11/27/2017), Disp: 30 tablet, Rfl: 0 .  dicyclomine (BENTYL) 20 MG tablet, Take 1 tablet (20 mg total) by mouth 3 (three) times daily as needed for spasms. (Patient not taking: Reported on 12/15/2019), Disp: 20 tablet, Rfl: 0 .  ondansetron (ZOFRAN ODT) 4 MG disintegrating tablet, Take 1 tablet (4 mg total) by mouth every 8 (eight) hours as needed for nausea or vomiting. (Patient not taking: Reported on 12/15/2019), Disp: 20 tablet, Rfl: 0 .  tizanidine (ZANAFLEX) 2 MG capsule, Take 1-2 capsules (2-4 mg total) 3 (three) times daily as needed by mouth for muscle spasms. Take 1 capsule daily before bed for 2-3 days, then may increase to 2 capsules. (Patient not taking: Reported on 11/27/2017), Disp: 45 capsule, Rfl: 0  No Known Allergies  I personally reviewed active problem list, medication list, allergies, notes from last encounter, lab results with the patient/caregiver today.  ROS  Ten systems reviewed and is negative except as mentioned in HPI  Objective  Virtual encounter, vitals not obtained.  There is no height or weight on file to calculate BMI.  Nursing Note and Vital Signs reviewed.  Physical Exam  Constitutional: Patient appears well-developed and well-nourished. No distress.  HENT: Head: Normocephalic and atraumatic.  Neck: Normal range of motion. Pulmonary/Chest: Effort normal. No respiratory distress. Speaking in complete  sentences Neurological: Pt is alert and oriented to person, place, and time. Coordination, speech and gait are normal.  Psychiatric: Patient has a normal mood and affect. behavior is normal. Judgment and thought content normal.   No results found for this or any previous visit (from the past 72 hour(s)).  Assessment & Plan  1. Productive cough - azithromycin (ZITHROMAX) 250 MG tablet; Day1: Take 2 tablets; Days 2-5: Take 1 tablet once daily  Dispense: 6 tablet; Refill: 0 - predniSONE (DELTASONE) 10 MG tablet; Day1:5tabs, Day2:4tabs, Day3:3tabs, Day4:2tabs, Day5:1tab  Dispense: 15 tablet; Refill: 0 - albuterol (VENTOLIN HFA) 108 (90 Base) MCG/ACT inhaler; Inhale 2 puffs into the lungs every 6 (six) hours as needed for wheezing or shortness of breath.  Dispense: 18 g; Refill: 1 - benzonatate (TESSALON PERLES) 100 MG capsule; Take 1 capsule (100 mg total) by mouth 3 (three) times daily as needed.  Dispense: 20 capsule; Refill: 0  2. History of asthma - azithromycin (ZITHROMAX) 250 MG tablet; Day1: Take 2 tablets; Days 2-5: Take 1 tablet once daily  Dispense: 6 tablet; Refill: 0 - predniSONE (DELTASONE) 10 MG tablet; Day1:5tabs, Day2:4tabs, Day3:3tabs, Day4:2tabs, Day5:1tab  Dispense: 15 tablet; Refill: 0 - albuterol (VENTOLIN HFA) 108 (90 Base) MCG/ACT inhaler; Inhale 2 puffs into the lungs every 6 (six) hours as needed for wheezing or shortness of breath.  Dispense: 18 g; Refill: 1  3. History of 2019 novel coronavirus disease (COVID-19)   -Red flags and when to present for emergency care or RTC including fever >101.30F, chest pain, shortness of breath, new/worsening/un-resolving symptoms, reviewed with patient at time of visit. Follow up and care instructions discussed and provided in AVS. - I discussed the assessment and treatment plan with the patient. The patient was provided an opportunity to ask questions and all were answered. The patient agreed with the plan and demonstrated an  understanding of the instructions.  I provided 14 minutes of non-face-to-face time during this encounter.  Hubbard Hartshorn, FNP

## 2020-12-08 ENCOUNTER — Other Ambulatory Visit: Payer: Self-pay

## 2020-12-08 ENCOUNTER — Encounter: Payer: Self-pay | Admitting: Family Medicine

## 2020-12-08 ENCOUNTER — Ambulatory Visit (INDEPENDENT_AMBULATORY_CARE_PROVIDER_SITE_OTHER): Payer: 59 | Admitting: Family Medicine

## 2020-12-08 VITALS — BP 130/100 | HR 88 | Ht 64.0 in | Wt 232.0 lb

## 2020-12-08 DIAGNOSIS — Z8742 Personal history of other diseases of the female genital tract: Secondary | ICD-10-CM | POA: Diagnosis not present

## 2020-12-08 DIAGNOSIS — I10 Essential (primary) hypertension: Secondary | ICD-10-CM

## 2020-12-08 DIAGNOSIS — Z7689 Persons encountering health services in other specified circumstances: Secondary | ICD-10-CM | POA: Diagnosis not present

## 2020-12-08 MED ORDER — HYDROCHLOROTHIAZIDE 12.5 MG PO TABS: 12.5000 mg | ORAL_TABLET | Freq: Every day | ORAL | 3 refills | Status: DC

## 2020-12-08 NOTE — Progress Notes (Signed)
Date:  12/08/2020   Name:  [name]   DOB:  [birth year]   MRN:  [id number]   Chief Complaint: Establish Care  Patient is a 30 year old female who presents for a establish care exam. The patient reports the following problems: abn pap. Health maintenance has been reviewed needs.   Lab Results  Component Value Date   CREATININE 0.65 02/25/2019   BUN 8 02/25/2019   NA 138 02/25/2019   K 3.7 02/25/2019   CL 107 02/25/2019   CO2 24 02/25/2019   No results found for: CHOL, HDL, LDLCALC, LDLDIRECT, TRIG, CHOLHDL Lab Results  Component Value Date   TSH 2.040 09/20/2017   No results found for: HGBA1C Lab Results  Component Value Date   WBC 7.7 02/25/2019   HGB 10.0 (L) 02/25/2019   HCT 33.6 (L) 02/25/2019   MCV 72.1 (L) 02/25/2019   PLT 541 (H) 02/25/2019   Lab Results  Component Value Date   ALT 9 02/25/2019   AST 15 02/25/2019   ALKPHOS 72 02/25/2019   BILITOT 0.4 02/25/2019     Review of Systems  Constitutional: Negative for chills and fever.  HENT: Negative for drooling, ear discharge, ear pain and sore throat.   Respiratory: Negative for cough, shortness of breath and wheezing.   Cardiovascular: Negative for chest pain, palpitations and leg swelling.  Gastrointestinal: Negative for abdominal pain, blood in stool, constipation, diarrhea and nausea.  Endocrine: Negative for polydipsia.  Genitourinary: Negative for dysuria, frequency, hematuria and urgency.  Musculoskeletal: Negative for back pain, myalgias and neck pain.  Skin: Negative for rash.  Allergic/Immunologic: Negative for environmental allergies.  Neurological: Negative for dizziness and headaches.  Hematological: Does not bruise/bleed easily.  Psychiatric/Behavioral: Negative for suicidal ideas. The patient is not nervous/anxious.     Patient Active Problem List   Diagnosis Date Noted  . Urticaria 04/02/2016  . Microcytic anemia 03/13/2016  . Pain in both hands 03/13/2016  . Pain in both feet  03/13/2016  . Abnormal weight gain 03/13/2016    No Known Allergies  Past Surgical History:  Procedure Laterality Date  . WISDOM TOOTH EXTRACTION      Social History   Tobacco Use  . Smoking status: Never Smoker  . Smokeless tobacco: Never Used  Vaping Use  . Vaping Use: Never used  Substance Use Topics  . Alcohol use: No    Alcohol/week: 0.0 standard drinks  . Drug use: No     Medication list has been reviewed and updated.  Current Meds  Medication Sig  . albuterol (VENTOLIN HFA) 108 (90 Base) MCG/ACT inhaler Inhale 2 puffs into the lungs every 6 (six) hours as needed for wheezing or shortness of breath.    PHQ 2/9 Scores 12/08/2020 12/15/2019 09/20/2017 03/13/2016  PHQ - 2 Score 0 0 0 0  PHQ- 9 Score 3 0 - -    GAD 7 : Generalized Anxiety Score 12/08/2020  Nervous, Anxious, on Edge 1  Control/stop worrying 0  Worry too much - different things 0  Trouble relaxing 0  Restless 0  Easily annoyed or irritable 0  Afraid - awful might happen 0  Total GAD 7 Score 1    BP Readings from Last 3 Encounters:  12/08/20 (!) 130/100  02/25/19 (!) 109/93  01/03/18 101/84    Physical Exam Vitals and nursing note reviewed.  Constitutional:      Appearance: She is well-developed and well-nourished.  HENT:     Head: Normocephalic.  Right Ear: Tympanic membrane and external ear normal.     Left Ear: Tympanic membrane and external ear normal.     Nose: Nose normal. No congestion or rhinorrhea.     Mouth/Throat:     Mouth: Oropharynx is clear and moist. Mucous membranes are moist.  Eyes:     General: Lids are everted, no foreign bodies appreciated. No scleral icterus.       Left eye: No foreign body or hordeolum.     Extraocular Movements: EOM normal.     Conjunctiva/sclera: Conjunctivae normal.     Right eye: Right conjunctiva is not injected.     Left eye: Left conjunctiva is not injected.     Pupils: Pupils are equal, round, and reactive to light.  Neck:     Thyroid:  No thyromegaly.     Vascular: No JVD.     Trachea: No tracheal deviation.  Cardiovascular:     Rate and Rhythm: Normal rate and regular rhythm.     Pulses: Intact distal pulses.     Heart sounds: Normal heart sounds. No murmur heard. No friction rub. No gallop.   Pulmonary:     Effort: Pulmonary effort is normal. No respiratory distress.     Breath sounds: Normal breath sounds. No wheezing, rhonchi or rales.  Abdominal:     General: Bowel sounds are normal.     Palpations: Abdomen is soft. There is no hepatosplenomegaly or mass.     Tenderness: There is no abdominal tenderness. There is no guarding or rebound.  Musculoskeletal:        General: No tenderness or edema. Normal range of motion.     Cervical back: Normal range of motion and neck supple.  Lymphadenopathy:     Cervical: No cervical adenopathy.  Skin:    General: Skin is warm.     Capillary Refill: Capillary refill takes less than 2 seconds.     Coloration: Skin is not jaundiced or pale.     Findings: No bruising, erythema or rash.  Neurological:     Mental Status: She is alert and oriented to person, place, and time.     Cranial Nerves: No cranial nerve deficit.     Deep Tendon Reflexes: Strength normal. Reflexes normal.  Psychiatric:        Mood and Affect: Mood and affect normal. Mood is not anxious or depressed.     Wt Readings from Last 3 Encounters:  12/08/20 232 lb (105.2 kg)  02/25/19 220 lb (99.8 kg)  01/03/18 215 lb (97.5 kg)    BP (!) 130/100   Pulse 88   Ht 5\' 4"  (1.626 m)   Wt 232 lb (105.2 kg)   BMI 39.82 kg/m   Assessment and Plan: 1. Establishing care with new doctor, encounter for Patient establishing care with new physician.  Patient is currently on gender affirmation hormones testosterone cypionate 200 mg/cc with current dosing 0.25 cc  weekly.  2. History of abnormal cervical Pap smear Patient has a history of abnormal Pap smear that has not been rePap in several years.  I cannot find  the degree of abnormality.  At this point in time we will suggest that he return to [facility] for a Pap smear and evaluation of a ovarian cyst that was noted in 2013.  3. Primary hypertension Chronic.  Repetitive.  Multiple readings of elevated blood pressures have been noted in review.  Today he has a blood pressure of 130/100.  Since this is been  a continuing trend we will initiate hydrochlorothiazide 12.5 mg once a day and will recheck blood pressure in the near future. - hydrochlorothiazide (HYDRODIURIL) 12.5 MG tablet; Take 1 tablet (12.5 mg total) by mouth daily.  Dispense: 90 tablet; Refill: 3  4. History of ovarian cyst On review of abdominal pelvic CT on 10/01/2012 patient was noted to have probable right adnexal cyst for which pelvic ultrasound was done in 2014.  Unfortunately ultrasound was only done of the abdomen and there was no pelvic ultrasound for confirmation.  Patient has been recommended to return to [facility] for evaluation of adnexal cyst of the right.  5. patient has a history of thoracic back pain probably secondary to significant gynecomastia.  Patient has an upcoming appointment with plastic surgery for evaluation as noted on documentation with Maurice Small in 2018.

## 2020-12-08 NOTE — Addendum Note (Signed)
Addended by: Duanne Limerick on: 12/08/2020 05:51 PM   Modules accepted: Level of Service

## 2021-01-17 ENCOUNTER — Ambulatory Visit: Payer: 59 | Admitting: Family Medicine

## 2021-01-24 ENCOUNTER — Ambulatory Visit (INDEPENDENT_AMBULATORY_CARE_PROVIDER_SITE_OTHER): Payer: 59 | Admitting: Family Medicine

## 2021-01-24 ENCOUNTER — Other Ambulatory Visit: Payer: Self-pay

## 2021-01-24 ENCOUNTER — Encounter: Payer: Self-pay | Admitting: Family Medicine

## 2021-01-24 VITALS — BP 118/80 | HR 64 | Ht 64.0 in | Wt 228.0 lb

## 2021-01-24 DIAGNOSIS — I10 Essential (primary) hypertension: Secondary | ICD-10-CM

## 2021-01-24 DIAGNOSIS — F640 Transsexualism: Secondary | ICD-10-CM | POA: Diagnosis not present

## 2021-01-24 MED ORDER — HYDROCHLOROTHIAZIDE 12.5 MG PO TABS: 12.5000 mg | ORAL_TABLET | Freq: Every day | ORAL | 3 refills | Status: DC

## 2021-01-24 NOTE — Progress Notes (Signed)
Date:  01/24/2021   Name:  [name]   DOB:  Apr 14, 1991   MRN:  [id number]   Chief Complaint: Hypertension  Hypertension This is a chronic problem. The current episode started more than 1 year ago. The problem has been gradually improving since onset. The problem is controlled. Pertinent negatives include no anxiety, blurred vision, chest pain, headaches, malaise/fatigue, neck pain, orthopnea, palpitations, peripheral edema, PND, shortness of breath or sweats. There are no associated agents to hypertension. Risk factors for coronary artery disease include dyslipidemia and female gender. Past treatments include diuretics. The current treatment provides moderate improvement. There are no compliance problems.  There is no history of angina, kidney disease, CAD/MI, CVA, heart failure, left ventricular hypertrophy, PVD or retinopathy. There is no history of chronic renal disease, a hypertension causing med or renovascular disease.    Lab Results  Component Value Date   CREATININE 0.65 02/25/2019   BUN 8 02/25/2019   NA 138 02/25/2019   K 3.7 02/25/2019   CL 107 02/25/2019   CO2 24 02/25/2019   No results found for: CHOL, HDL, LDLCALC, LDLDIRECT, TRIG, CHOLHDL Lab Results  Component Value Date   TSH 2.040 09/20/2017   No results found for: HGBA1C Lab Results  Component Value Date   WBC 7.7 02/25/2019   HGB 10.0 (L) 02/25/2019   HCT 33.6 (L) 02/25/2019   MCV 72.1 (L) 02/25/2019   PLT 541 (H) 02/25/2019   Lab Results  Component Value Date   ALT 9 02/25/2019   AST 15 02/25/2019   ALKPHOS 72 02/25/2019   BILITOT 0.4 02/25/2019     Review of Systems  Constitutional: Negative.  Negative for chills, fatigue, fever, malaise/fatigue and unexpected weight change.  HENT: Negative for congestion, ear discharge, ear pain, rhinorrhea, sinus pressure, sneezing and sore throat.   Eyes: Negative for blurred vision, photophobia, pain, discharge, redness and itching.  Respiratory:  Negative for cough, shortness of breath, wheezing and stridor.   Cardiovascular: Negative for chest pain, palpitations, orthopnea and PND.  Gastrointestinal: Negative for abdominal pain, blood in stool, constipation, diarrhea, nausea and vomiting.  Endocrine: Negative for cold intolerance, heat intolerance, polydipsia, polyphagia and polyuria.  Genitourinary: Negative for dysuria, flank pain, frequency, hematuria, menstrual problem, pelvic pain, urgency, vaginal bleeding and vaginal discharge.  Musculoskeletal: Negative for arthralgias, back pain, myalgias and neck pain.  Skin: Negative for rash.  Allergic/Immunologic: Negative for environmental allergies and food allergies.  Neurological: Negative for dizziness, weakness, light-headedness, numbness and headaches.  Hematological: Negative for adenopathy. Does not bruise/bleed easily.  Psychiatric/Behavioral: Negative for dysphoric mood. The patient is not nervous/anxious.     Patient Active Problem List   Diagnosis Date Noted  . Urticaria 04/02/2016  . Microcytic anemia 03/13/2016  . Pain in both hands 03/13/2016  . Pain in both feet 03/13/2016  . Abnormal weight gain 03/13/2016    No Known Allergies  Past Surgical History:  Procedure Laterality Date  . WISDOM TOOTH EXTRACTION      Social History   Tobacco Use  . Smoking status: Never Smoker  . Smokeless tobacco: Never Used  Vaping Use  . Vaping Use: Never used  Substance Use Topics  . Alcohol use: No    Alcohol/week: 0.0 standard drinks  . Drug use: No     Medication list has been reviewed and updated.  Current Meds  Medication Sig  . albuterol (VENTOLIN HFA) 108 (90 Base) MCG/ACT inhaler Inhale 2 puffs into the lungs every 6 (six)  hours as needed for wheezing or shortness of breath.  . B-D SYRINGE LUER-LOK 1CC 1 ML MISC See admin instructions.  . BD HYPODERMIC NEEDLE 18G X 1" MISC See admin instructions.  . hydrochlorothiazide (HYDRODIURIL) 12.5 MG tablet Take 1  tablet (12.5 mg total) by mouth daily.  Marland Kitchen testosterone cypionate (DEPOTESTOSTERONE CYPIONATE) 200 MG/ML injection SMARTSIG:0.5 Milliliter(s) SUB-Q Once a Week    PHQ 2/9 Scores 12/08/2020 12/15/2019 09/20/2017 03/13/2016  PHQ - 2 Score 0 0 0 0  PHQ- 9 Score 3 0 - -    GAD 7 : Generalized Anxiety Score 12/08/2020  Nervous, Anxious, on Edge 1  Control/stop worrying 0  Worry too much - different things 0  Trouble relaxing 0  Restless 0  Easily annoyed or irritable 0  Afraid - awful might happen 0  Total GAD 7 Score 1    BP Readings from Last 3 Encounters:  01/24/21 118/80  12/08/20 (!) 130/100  02/25/19 (!) 109/93    Physical Exam Vitals and nursing note reviewed.  Constitutional:      Appearance: She is well-developed.  HENT:     Head: Normocephalic.     Right Ear: Tympanic membrane, ear canal and external ear normal. There is no impacted cerumen.     Left Ear: Tympanic membrane, ear canal and external ear normal.     Nose: No congestion or rhinorrhea.     Mouth/Throat:     Mouth: Mucous membranes are moist.  Eyes:     General: Lids are everted, no foreign bodies appreciated. No scleral icterus.       Left eye: No foreign body or hordeolum.     Conjunctiva/sclera: Conjunctivae normal.     Right eye: Right conjunctiva is not injected.     Left eye: Left conjunctiva is not injected.     Pupils: Pupils are equal, round, and reactive to light.  Neck:     Thyroid: No thyromegaly.     Vascular: No JVD.     Trachea: No tracheal deviation.  Cardiovascular:     Rate and Rhythm: Normal rate and regular rhythm.     Heart sounds: Normal heart sounds. No murmur heard. No friction rub. No gallop.   Pulmonary:     Effort: Pulmonary effort is normal. No respiratory distress.     Breath sounds: Normal breath sounds. No wheezing, rhonchi or rales.  Abdominal:     General: Bowel sounds are normal.     Palpations: Abdomen is soft. There is no mass.     Tenderness: There is no abdominal  tenderness. There is no guarding or rebound.  Musculoskeletal:        General: No tenderness. Normal range of motion.     Cervical back: Normal range of motion and neck supple.  Lymphadenopathy:     Cervical: No cervical adenopathy.  Skin:    General: Skin is warm.     Capillary Refill: Capillary refill takes less than 2 seconds.     Findings: No rash.  Neurological:     Mental Status: She is alert and oriented to person, place, and time.     Cranial Nerves: No cranial nerve deficit.     Deep Tendon Reflexes: Reflexes normal.  Psychiatric:        Mood and Affect: Mood is not anxious or depressed.     Wt Readings from Last 3 Encounters:  01/24/21 228 lb (103.4 kg)  12/08/20 232 lb (105.2 kg)  02/25/19 220 lb (99.8 kg)  BP 118/80   Pulse 64   Ht 5\' 4"  (1.626 m)   Wt 228 lb (103.4 kg)   BMI 39.14 kg/m   Assessment and Plan: 1. Primary hypertension Chronic.  Controlled.  Stable.  Blood pressure today is 118/80.  Continue hydrochlorothiazide 12.5 mg once a day.  Will recheck in 6 months. - hydrochlorothiazide (HYDRODIURIL) 12.5 MG tablet; Take 1 tablet (12.5 mg total) by mouth daily.  Dispense: 90 tablet; Refill: 3  2. Gender dysphoria in adult Patient with history of gender dysphoria which is anticipating type surgery as F2M.  Patient is required to have psychiatric clearance per surgeon prior to procedure and referral has been made to psychiatry for evaluation. - Ambulatory referral to Psychiatry

## 2021-03-03 ENCOUNTER — Telehealth: Payer: Self-pay

## 2021-03-03 ENCOUNTER — Ambulatory Visit: Payer: Self-pay | Admitting: *Deleted

## 2021-03-03 NOTE — Telephone Encounter (Unsigned)
Copied from CRM [id number]. Topic: General - Other >> Mar 03, 2021 11:25 AM Pawlus, Maxine Glenn A wrote: Reason for CRM: Pt wanted to speak with Dr Yetta Barre or someone directly at the office, did not go into detail about what it was regarding but only stated he really wanted a call back by today if possible, Please advise.

## 2021-03-03 NOTE — Telephone Encounter (Signed)
I returned pt's call.   Pt is broke out in hives on both knees, thighs, arms.  She woke up with these hives night before last.   They itch and burn.  See notes below.    She has been dealing with this for 3 1/2 years.   Saw an allergist too.  [facility] is closed early this evening so I have instructed her to go to the urgent care or ED.   She was agreeable to this. I instructed her to go to the ED if the hives started spreading especially to the neck and face or had swelling in her throat, tongue or lips. She verbalized understanding.  She is going to the urgent care today.  Reason for Disposition . [1] MODERATE-SEVERE hives persist (i.e., hives interfere with normal activities or work) AND [2] taking antihistamine (e.g., Benadryl, Claritin) > 24 hours  Answer Assessment - Initial Assessment Questions 1. APPEARANCE: "What does the rash look like?"      I have hives on knees, thigh, arms.   I took an allergy test 3 years and nothing showed up for allergies. 2. LOCATION: "Where is the rash located?"      See above  They itch and burn.   3. NUMBER: "How many hives are there?"      Many  4. SIZE: "How big are the hives?" (inches, cm, compare to coins) "Do they all look the same or is there lots of variation in shape and size?"      Water filled blisters. 5. ONSET: "When did the hives begin?" (Hours or days ago)      3 1/2 years ago I started having these hives all over my body for some reason.  I've taken so many antihistamines nothing works for me anymore.   I saw an allergy doctor and I didn't react to any of the allergy tests.   Night before last I woke up with the hives.   6. ITCHING: "Does it itch?" If Yes, ask: "How bad is the itch?"    - MILD: doesn't interfere with normal activities   - MODERATE-SEVERE: interferes with work, school, sleep, or other activities      Yes itching and burning   7. RECURRENT PROBLEM: "Have you had hives before?" If Yes, ask: "When was the last  time?" and "What happened that time?"      Yes  I have tried every thing over the counter.   I've been dealing with this on and off for 3 1/2 years. 8. TRIGGERS: "Were you exposed to any new food, plant, cosmetic product or animal just before the hives began?"     No  9. OTHER SYMPTOMS: "Do you have any other symptoms?" (e.g., fever, tongue swelling, difficulty breathing, abdominal pain)     Denies shortness of breath or chest tightness, throat swelling, tongue or lips swelling. 10. PREGNANCY: "Is there any chance you are pregnant?" "When was your last menstrual period?"       Not asked  Protocols used: HIVES-A-AH

## 2021-03-03 NOTE — Telephone Encounter (Signed)
Pt called back and I am sending a message to NT pt has hives all over

## 2021-03-06 ENCOUNTER — Other Ambulatory Visit: Payer: Self-pay

## 2021-03-06 ENCOUNTER — Ambulatory Visit
Admission: EM | Admit: 2021-03-06 | Discharge: 2021-03-06 | Disposition: A | Discharge status: Home / Self Care | Payer: 59 | Attending: Emergency Medicine | Admitting: Emergency Medicine

## 2021-03-06 DIAGNOSIS — L509 Urticaria, unspecified: Secondary | ICD-10-CM

## 2021-03-06 MED ORDER — PREDNISONE 10 MG (21) PO TBPK: ORAL_TABLET | Freq: Every day | ORAL | 0 refills | Status: DC

## 2021-03-06 NOTE — ED Triage Notes (Signed)
Pt presents with hives. Has had the hives flare up every year at this time since 2017 but they have been constant x 1 year.  Has tried multiple OTC allergy meds incl Claritin, Zyrtec, Benadryl etc but no relief.  Hives generalized over entire body.  Saw allergist in 2017 and was told no allergies.

## 2021-03-06 NOTE — ED Provider Notes (Signed)
[name]    CSN: [id number] Arrival date & time: 03/06/21  1106      History   Chief Complaint Chief Complaint  Patient presents with  . Urticaria    HPI [name] is a 30 y.o. female.   Patient presents with hives x5 days.  No new products, medications, foods.  She states she has had intermittent hives for several years; she saw an allergist in 2017.  She has been taking Zyrtec for her symptoms.  She denies fever, chills, difficulty swallowing, sore throat, cough, shortness of breath, or other symptoms.  Her medical history includes hives, asthma, allergies, anemia.  The history is provided by the patient and medical records.    Past Medical History:  Diagnosis Date  . Allergy   . Asthma   . Hives   . Microcytic anemia 03/13/2016    Patient Active Problem List   Diagnosis Date Noted  . Urticaria 04/02/2016  . Microcytic anemia 03/13/2016  . Pain in both hands 03/13/2016  . Pain in both feet 03/13/2016  . Abnormal weight gain 03/13/2016    Past Surgical History:  Procedure Laterality Date  . WISDOM TOOTH EXTRACTION      OB History   No obstetric history on file.      Home Medications    Prior to Admission medications   Medication Sig Start Date End Date Taking? Authorizing Provider  albuterol (VENTOLIN HFA) 108 (90 Base) MCG/ACT inhaler Inhale 2 puffs into the lungs every 6 (six) hours as needed for wheezing or shortness of breath. 12/15/19  Yes Doren Custard, FNP  B-D SYRINGE LUER-LOK 1CC 1 ML MISC See admin instructions. 11/16/20  Yes [provider]  BD HYPODERMIC NEEDLE 18G X 1" MISC See admin instructions. 11/11/20  Yes [provider]  predniSONE (STERAPRED UNI-PAK 21 TAB) 10 MG (21) TBPK tablet Take by mouth daily. As directed 03/06/21  Yes Mickie Bail, NP  testosterone cypionate (DEPOTESTOSTERONE CYPIONATE) 200 MG/ML injection SMARTSIG:0.5 Milliliter(s) SUB-Q Once a Week 11/11/20  Yes [provider]   hydrochlorothiazide (HYDRODIURIL) 12.5 MG tablet Take 1 tablet (12.5 mg total) by mouth daily. 01/24/21   Duanne Limerick, MD    Family History Family History  Problem Relation Age of Onset  . Diabetes Father   . Diabetes Maternal Grandfather   . Cancer Maternal Grandfather        lung    Social History Social History   Tobacco Use  . Smoking status: Never Smoker  . Smokeless tobacco: Never Used  Vaping Use  . Vaping Use: Never used  Substance Use Topics  . Alcohol use: No    Alcohol/week: 0.0 standard drinks  . Drug use: No     Allergies   Patient has no known allergies.   Review of Systems Review of Systems  Constitutional: Negative for chills and fever.  HENT: Negative for ear pain and sore throat.   Respiratory: Negative for cough and shortness of breath.   Cardiovascular: Negative for chest pain and palpitations.  Gastrointestinal: Negative for abdominal pain and vomiting.  Skin: Positive for rash. Negative for color change.  Neurological: Negative for weakness and numbness.  All other systems reviewed and are negative.    Physical Exam Triage Vital Signs ED Triage Vitals  Enc Vitals Group     BP      Pulse      Resp      Temp      Temp src  SpO2      Weight      Height      Head Circumference      Peak Flow      Pain Score      Pain Loc      Pain Edu?      Excl. in GC?    No data found.  Updated Vital Signs BP 113/73 (BP Location: Left Arm)   Pulse 98   Temp 98.4 F (36.9 C) (Oral)   Resp 18   SpO2 98%   Visual Acuity Right Eye Distance:   Left Eye Distance:   Bilateral Distance:    Right Eye Near:   Left Eye Near:    Bilateral Near:     Physical Exam Vitals and nursing note reviewed.  Constitutional:      General: She is not in acute distress.    Appearance: She is well-developed. She is not ill-appearing.  HENT:     Head: Normocephalic and atraumatic.     Mouth/Throat:     Mouth: Mucous membranes are moist.      Pharynx: Oropharynx is clear.     Comments: Speech clear.  No oropharyngeal swelling.  No difficulty swallowing. Eyes:     Conjunctiva/sclera: Conjunctivae normal.  Cardiovascular:     Rate and Rhythm: Normal rate and regular rhythm.     Heart sounds: Normal heart sounds.  Pulmonary:     Effort: Pulmonary effort is normal. No respiratory distress.     Breath sounds: Normal breath sounds.  Abdominal:     Palpations: Abdomen is soft.     Tenderness: There is no abdominal tenderness.  Musculoskeletal:     Cervical back: Neck supple.  Skin:    General: Skin is warm and dry.     Findings: Rash present.     Comments: Hives on trunk and extremities.  Neurological:     General: No focal deficit present.     Mental Status: She is alert and oriented to person, place, and time.     Gait: Gait normal.  Psychiatric:        Mood and Affect: Mood normal.        Behavior: Behavior normal.      UC Treatments / Results  Labs (all labs ordered are listed, but only abnormal results are displayed) Labs Reviewed - No data to display  EKG   Radiology No results found.  Procedures Procedures (including critical care time)  Medications Ordered in UC Medications - No data to display  Initial Impression / Assessment and Plan / UC Course  I have reviewed the triage vital signs and the nursing notes.  Pertinent labs & imaging results that were available during my care of the patient were reviewed by me and considered in my medical decision making (see chart for details).   Hives.  Treating with prednisone and Benadryl.  Precautions for drowsiness with Benadryl discussed.  Instructed patient to call 911 and go to the ED if she has difficulty swallowing or breathing.  Discussed that she should follow-up with her PCP or allergist if her symptoms are not improving.  She agrees to plan of care.   Final Clinical Impressions(s) / UC Diagnoses   Final diagnoses:  Hives     Discharge  Instructions     Take the prednisone as directed.    Take Benadryl every 6 hours as directed; do not drive, operate machinery, or drink alcohol with this medication as it may cause drowsiness.  Go to the emergency department if you have difficulty swallowing or breathing.    Follow up with your primary care provider if your symptoms are not improving.          ED Prescriptions    Medication Sig Dispense Auth. Provider   predniSONE (STERAPRED UNI-PAK 21 TAB) 10 MG (21) TBPK tablet Take by mouth daily. As directed 21 tablet Mickie Bail, NP     PDMP not reviewed this encounter.   Mickie Bail, NP 03/06/21 1145

## 2021-03-06 NOTE — Telephone Encounter (Signed)
Pt can get on schedule for this afternoon if you would like to call her

## 2021-03-06 NOTE — Discharge Instructions (Signed)
Take the prednisone as directed.    Take Benadryl every 6 hours as directed; do not drive, operate machinery, or drink alcohol with this medication as it may cause drowsiness.    Go to the emergency department if you have difficulty swallowing or breathing.    Follow up with your primary care provider if your symptoms are not improving.

## 2021-03-06 NOTE — Telephone Encounter (Signed)
Left voice mail to call to set up appointment 

## 2021-03-10 ENCOUNTER — Other Ambulatory Visit: Payer: Self-pay

## 2021-03-10 ENCOUNTER — Encounter: Payer: Self-pay | Admitting: Family Medicine

## 2021-03-10 ENCOUNTER — Ambulatory Visit (INDEPENDENT_AMBULATORY_CARE_PROVIDER_SITE_OTHER): Payer: 59 | Admitting: Family Medicine

## 2021-03-10 VITALS — BP 120/90 | HR 72 | Ht 64.0 in | Wt 229.0 lb

## 2021-03-10 DIAGNOSIS — Z8709 Personal history of other diseases of the respiratory system: Secondary | ICD-10-CM

## 2021-03-10 DIAGNOSIS — L501 Idiopathic urticaria: Secondary | ICD-10-CM | POA: Diagnosis not present

## 2021-03-10 MED ORDER — PREDNISONE 10 MG PO TABS: 10.0000 mg | ORAL_TABLET | Freq: Every day | ORAL | 0 refills | Status: DC

## 2021-03-10 NOTE — Progress Notes (Addendum)
Date:  03/10/2021   Name:  [name]   DOB:  05-14-1991   MRN:  [id number]   Chief Complaint: Urticaria (Was seen in urgent care on 5/2- was given Pred pack. Since taking pred pack- seems to have spread and gotten more irritated. Now is on arms, legs, chest and face.)  Urticaria This is a recurrent problem. The current episode started in the past 7 days. The problem has been waxing and waning since onset. The rash is diffuse. The rash is characterized by redness, swelling and itchiness (whelps). She was exposed to a new medication. Pertinent negatives include no congestion, cough, diarrhea, eye pain, fatigue, fever, rhinorrhea, shortness of breath, sore throat or vomiting.    Lab Results  Component Value Date   CREATININE 0.65 02/25/2019   BUN 8 02/25/2019   NA 138 02/25/2019   K 3.7 02/25/2019   CL 107 02/25/2019   CO2 24 02/25/2019   No results found for: CHOL, HDL, LDLCALC, LDLDIRECT, TRIG, CHOLHDL Lab Results  Component Value Date   TSH 2.040 09/20/2017   No results found for: HGBA1C Lab Results  Component Value Date   WBC 7.7 02/25/2019   HGB 10.0 (L) 02/25/2019   HCT 33.6 (L) 02/25/2019   MCV 72.1 (L) 02/25/2019   PLT 541 (H) 02/25/2019   Lab Results  Component Value Date   ALT 9 02/25/2019   AST 15 02/25/2019   ALKPHOS 72 02/25/2019   BILITOT 0.4 02/25/2019     Review of Systems  Constitutional: Negative.  Negative for chills, fatigue, fever and unexpected weight change.  HENT: Negative for congestion, ear discharge, ear pain, rhinorrhea, sinus pressure, sneezing and sore throat.   Eyes: Negative for photophobia, pain, discharge, redness and itching.  Respiratory: Negative for cough, shortness of breath, wheezing and stridor.   Gastrointestinal: Negative for abdominal pain, blood in stool, constipation, diarrhea, nausea and vomiting.  Endocrine: Negative for cold intolerance, heat intolerance, polydipsia, polyphagia and polyuria.  Genitourinary:  Negative for dysuria, flank pain, frequency, hematuria, menstrual problem, pelvic pain, urgency, vaginal bleeding and vaginal discharge.  Musculoskeletal: Negative for arthralgias, back pain and myalgias.  Skin: Negative for rash.  Allergic/Immunologic: Negative for environmental allergies and food allergies.  Neurological: Negative for dizziness, weakness, light-headedness, numbness and headaches.  Hematological: Negative for adenopathy. Does not bruise/bleed easily.  Psychiatric/Behavioral: Negative for dysphoric mood. The patient is not nervous/anxious.     Patient Active Problem List   Diagnosis Date Noted  . Urticaria 04/02/2016  . Microcytic anemia 03/13/2016  . Pain in both hands 03/13/2016  . Pain in both feet 03/13/2016  . Abnormal weight gain 03/13/2016    No Known Allergies  Past Surgical History:  Procedure Laterality Date  . WISDOM TOOTH EXTRACTION      Social History   Tobacco Use  . Smoking status: Never Smoker  . Smokeless tobacco: Never Used  Vaping Use  . Vaping Use: Never used  Substance Use Topics  . Alcohol use: No    Alcohol/week: 0.0 standard drinks  . Drug use: No     Medication list has been reviewed and updated.  Current Meds  Medication Sig  . albuterol (VENTOLIN HFA) 108 (90 Base) MCG/ACT inhaler Inhale 2 puffs into the lungs every 6 (six) hours as needed for wheezing or shortness of breath.  . B-D SYRINGE LUER-LOK 1CC 1 ML MISC See admin instructions.  . BD HYPODERMIC NEEDLE 18G X 1" MISC See admin instructions.  . hydrochlorothiazide (HYDRODIURIL)  12.5 MG tablet Take 1 tablet (12.5 mg total) by mouth daily.  . predniSONE (STERAPRED UNI-PAK 21 TAB) 10 MG (21) TBPK tablet Take by mouth daily. As directed  . testosterone cypionate (DEPOTESTOSTERONE CYPIONATE) 200 MG/ML injection SMARTSIG:0.5 Milliliter(s) SUB-Q Once a Week    PHQ 2/9 Scores 12/08/2020 12/15/2019 09/20/2017 03/13/2016  PHQ - 2 Score 0 0 0 0  PHQ- 9 Score 3 0 - -    GAD 7 :  Generalized Anxiety Score 12/08/2020  Nervous, Anxious, on Edge 1  Control/stop worrying 0  Worry too much - different things 0  Trouble relaxing 0  Restless 0  Easily annoyed or irritable 0  Afraid - awful might happen 0  Total GAD 7 Score 1    BP Readings from Last 3 Encounters:  03/10/21 120/90  03/06/21 113/73  01/24/21 118/80    Physical Exam Vitals and nursing note reviewed.  Constitutional:      Appearance: She is well-developed.  HENT:     Head: Normocephalic.     Right Ear: External ear normal.     Left Ear: External ear normal.  Eyes:     General: Lids are everted, no foreign bodies appreciated. No scleral icterus.       Left eye: No foreign body or hordeolum.     Conjunctiva/sclera: Conjunctivae normal.     Right eye: Right conjunctiva is not injected.     Left eye: Left conjunctiva is not injected.     Pupils: Pupils are equal, round, and reactive to light.  Neck:     Thyroid: No thyromegaly.     Vascular: No JVD.     Trachea: No tracheal deviation.  Cardiovascular:     Rate and Rhythm: Normal rate and regular rhythm.     Heart sounds: Normal heart sounds. No murmur heard. No friction rub. No gallop.   Pulmonary:     Effort: Pulmonary effort is normal. No respiratory distress.     Breath sounds: Normal breath sounds. No wheezing, rhonchi or rales.  Abdominal:     General: Bowel sounds are normal.     Palpations: Abdomen is soft. There is no mass.     Tenderness: There is no abdominal tenderness. There is no guarding or rebound.  Musculoskeletal:        General: No tenderness. Normal range of motion.     Cervical back: Normal range of motion and neck supple.  Lymphadenopathy:     Cervical: No cervical adenopathy.  Skin:    General: Skin is warm.     Findings: Rash present. Rash is urticarial.     Comments: generalized  Neurological:     Mental Status: She is alert and oriented to person, place, and time.     Cranial Nerves: No cranial nerve deficit.      Deep Tendon Reflexes: Reflexes normal.  Psychiatric:        Mood and Affect: Mood is not anxious or depressed.     Wt Readings from Last 3 Encounters:  03/10/21 229 lb (103.9 kg)  01/24/21 228 lb (103.4 kg)  12/08/20 232 lb (105.2 kg)    BP 120/90   Pulse 72   Ht 5\' 4"  (1.626 m)   Wt 229 lb (103.9 kg)   BMI 39.31 kg/m   Assessment and Plan:  1. Chronic idiopathic urticaria Chronic.  Uncontrolled.  Active presently.  Patient was seen earlier in the week at urgent care was put on a Sterapred pack.  We will continue  on prednisone 10 mg once a day.  Patient has seen allergist in 2017 at [facility] but there was no specific pain there was noted to cause these episodic circumstances of her urticaria.  We will reassess allergy testing with ENT Dr. Ernestine Mcmurray for evaluation in the meantime patient has been told that I would like for him to take Zyrtec in the morning with hydroxyzine prescribed at night.  It was also noted that patient was in March started on hydrochlorothiazide by myself and we will discontinue that because this is the timing that he may be developing an allergy to that particular medication.  Information has been given on urticaria and patient is to avoid particularly shellfish nuts milk and if possible we related substances.  2 areas that I do not think that have been checked in the past may be alpha gal and a gluten sensitivity and we will obtain labs pertaining to this at this time. - Ambulatory referral to ENT - Alpha-Gal Panel - predniSONE (DELTASONE) 10 MG tablet; Take 1 tablet (10 mg total) by mouth daily with breakfast.  Dispense: 30 tablet; Refill: 0  2. History of asthma Patient has a history of asthma we will continue to use his inhalers on an as-needed basis

## 2021-03-10 NOTE — Patient Instructions (Signed)
Angioedema Angioedema is swelling in the body. The swelling can occur in any part of the body. It often happens on the skin. It may cause itchy, bumpy patches (hives) to form. This condition may:  Occur only one time.  Happen more than one time. It can also stop at any time.  Keep coming back for a number of years. Someday it may stop. What are the causes? This condition may be caused by:  Foods, such as milk, eggs, shellfish, wheat, or nuts.  Medicines, such as ACE inhibitors, antibiotics, NSAIDs, birth control pills, or dyes used in X-rays. Hereditary angioedema (HAE) is passed from parent to child. Symptoms can occur because of:  Illness, infection, or stress.  Changes in hormones.  Exercise.  Minor surgery.  Dental work. In some cases, the cause of this condition may not be known. What increases the risk? You are more likely to have HAE if you have family members with this condition. What are the signs or symptoms? Symptoms of this condition include:  Swollen skin.  Red, itchy patches of skin.  Pain, pressure, or tenderness in the affected area.  Swollen eyelids, face, lips, or tongue.  Wheezing.  Trouble drinking, swallowing, or closing the mouth completely.  Being hoarse or having a sore throat.  Problems breathing. If your organs are affected:  You may feel like vomiting.  You may have pain in your belly.  You may vomit or have watery poop (diarrhea).  You may have trouble swallowing.  You may have trouble peeing.   How is this treated? To treat this condition, you may be told:  To avoid things that cause attacks (triggers). These include foods or things that cause allergies.  To stop medicines that cause the condition.  To take medicines to treat the condition. In very bad cases, a breathing tube or a machine that helps with breathing (ventilator) may be used. Follow these instructions at home:  Take all medicines only as told by your  doctor.  If you were given medicines to treat allergies, always carry them with you.  Wear a medical bracelet as told by your doctor.  Avoid the things that cause attacks. These may include: ? Foods. ? Things in your environment (such as pollen). ? Stress. ? Exercise.  Avoid all medicines that caused the attacks.  Talk to your doctor before you have kids. Some types of this condition may be passed from parent to child.   Where to find more information  American Academy of Allergy Asthma & Immunology: www.aaaai.org Contact a doctor if:  You have another attack.  Your attacks happen more often, even after you take steps to prevent them.  Your attacks are worse every time they occur.  This condition was passed to you by your parents and you want to have kids. Get help right away if:  Your mouth, tongue, or lips get very swollen.  Your swelling becomes worse.  You have trouble breathing.  You have trouble swallowing.  You have trouble talking.  You have chest pain or you feel dizzy.  You faint. These symptoms may be an emergency. Do not wait to see if the symptoms will go away. Get help right away. Call your local emergency services (911 in the [place] Do not drive yourself to the hospital. Summary  Angioedema is swelling that can happen in any part of the body.  It can be caused by the food you eat or the medicines you are taking. It can also be   passed from parent to child.  Avoid the things that cause your attacks. These can be food, medicines, or things in your environment.  If you were given medicines for allergies, always carry them with you.  Get help right away if your mouth, tongue, or lips get swollen. Also, get help right away if you have trouble breathing or swallowing. This information is not intended to replace advice given to you by your health care provider. Make sure you discuss any questions you have with your health care provider. Document Revised:  09/29/2019 Document Reviewed: 09/29/2019 Elsevier Patient Education  2021 Elsevier Inc.  

## 2021-03-14 LAB — GLIA (IGA/G) + TTG IGA
Antigliadin Abs, IgA: 4 units (ref 0–19)
Gliadin IgG: 2 units (ref 0–19)
Transglutaminase IgA: <2 U/mL (ref 0–3)

## 2021-03-18 LAB — ALPHA-GAL PANEL
Allergen Lamb IgE: <0.1 kU/L
Beef IgE: <0.1 kU/L
IgE (Immunoglobulin E), Serum: <2 IU/mL — ABNORMAL LOW (ref 6–495)
O215-IgE Alpha-Gal: <0.1 kU/L
Pork IgE: <0.1 kU/L

## 2021-04-05 ENCOUNTER — Encounter: Payer: Self-pay | Admitting: Internal Medicine

## 2021-04-05 ENCOUNTER — Inpatient Hospital Stay: Payer: 59 | Attending: Internal Medicine | Admitting: Internal Medicine

## 2021-04-05 ENCOUNTER — Inpatient Hospital Stay: Payer: 59 | Admitting: *Deleted

## 2021-04-05 VITALS — BP 115/72 | HR 73 | Temp 96.0°F | Resp 16 | Wt 226.0 lb

## 2021-04-05 DIAGNOSIS — D75839 Thrombocytosis, unspecified: Secondary | ICD-10-CM | POA: Diagnosis not present

## 2021-04-05 DIAGNOSIS — Z79899 Other long term (current) drug therapy: Secondary | ICD-10-CM | POA: Insufficient documentation

## 2021-04-05 DIAGNOSIS — D509 Iron deficiency anemia, unspecified: Secondary | ICD-10-CM | POA: Diagnosis present

## 2021-04-05 DIAGNOSIS — K921 Melena: Secondary | ICD-10-CM | POA: Insufficient documentation

## 2021-04-05 LAB — CBC WITH DIFFERENTIAL/PLATELET
Abs Immature Granulocytes: 0.07 10*3/uL (ref 0.00–0.07)
Basophils Absolute: 0 10*3/uL (ref 0.0–0.1)
Basophils Relative: 0 %
Eosinophils Absolute: 0 10*3/uL (ref 0.0–0.5)
Eosinophils Relative: 0 %
HCT: 41 % (ref 36.0–46.0)
Hemoglobin: 12 g/dL (ref 12.0–15.0)
Immature Granulocytes: 1 %
Lymphocytes Relative: 30 %
Lymphs Abs: 3.5 10*3/uL (ref 0.7–4.0)
MCH: 20.4 pg — ABNORMAL LOW (ref 26.0–34.0)
MCHC: 29.3 g/dL — ABNORMAL LOW (ref 30.0–36.0)
MCV: 69.6 fL — ABNORMAL LOW (ref 80.0–100.0)
Monocytes Absolute: 0.8 10*3/uL (ref 0.1–1.0)
Monocytes Relative: 7 %
Neutro Abs: 7.3 10*3/uL (ref 1.7–7.7)
Neutrophils Relative %: 62 %
Platelets: 497 10*3/uL — ABNORMAL HIGH (ref 150–400)
RBC: 5.89 MIL/uL — ABNORMAL HIGH (ref 3.87–5.11)
RDW: 20.6 % — ABNORMAL HIGH (ref 11.5–15.5)
Smear Review: INCREASED
WBC: 11.8 10*3/uL — ABNORMAL HIGH (ref 4.0–10.5)
nRBC: 0 % (ref 0.0–0.2)

## 2021-04-05 LAB — COMPREHENSIVE METABOLIC PANEL
ALT: 17 U/L (ref 0–44)
AST: 17 U/L (ref 15–41)
Albumin: 3.8 g/dL (ref 3.5–5.0)
Alkaline Phosphatase: 62 U/L (ref 38–126)
Anion gap: 8 (ref 5–15)
BUN: 8 mg/dL (ref 6–20)
CO2: 24 mmol/L (ref 22–32)
Calcium: 9.1 mg/dL (ref 8.9–10.3)
Chloride: 105 mmol/L (ref 98–111)
Creatinine, Ser: 0.69 mg/dL (ref 0.44–1.00)
GFR, Estimated: >60 mL/min (ref >60)
Glucose, Bld: 90 mg/dL (ref 70–99)
Potassium: 3.9 mmol/L (ref 3.5–5.1)
Sodium: 137 mmol/L (ref 135–145)
Total Bilirubin: 0.7 mg/dL (ref 0.3–1.2)
Total Protein: 7.3 g/dL (ref 6.5–8.1)

## 2021-04-05 LAB — TECHNOLOGIST SMEAR REVIEW

## 2021-04-05 LAB — RETICULOCYTES
Immature Retic Fract: 30.7 % — ABNORMAL HIGH (ref 2.3–15.9)
RBC.: 5.82 MIL/uL — ABNORMAL HIGH (ref 3.87–5.11)
Retic Count, Absolute: 85.6 10*3/uL (ref 19.0–186.0)
Retic Ct Pct: 1.5 % (ref 0.4–3.1)

## 2021-04-05 LAB — IRON AND TIBC
Iron: 22 ug/dL — ABNORMAL LOW (ref 28–170)
Saturation Ratios: 5 % — ABNORMAL LOW (ref 10.4–31.8)
TIBC: 489 ug/dL — ABNORMAL HIGH (ref 250–450)
UIBC: 467 ug/dL

## 2021-04-05 LAB — LACTATE DEHYDROGENASE: LDH: 151 U/L (ref 98–192)

## 2021-04-05 LAB — FERRITIN: Ferritin: 3 ng/mL — ABNORMAL LOW (ref 11–307)

## 2021-04-05 NOTE — Progress Notes (Signed)
[facility]. Pt currently on testosterone therapy weekly and prefers to be called Chaz.

## 2021-04-05 NOTE — Progress Notes (Signed)
[facility] CONSULT NOTE  Patient Care Team: Duanne Limerick, MD as PCP - General (Family Medicine)  CHIEF COMPLAINTS/PURPOSE OF CONSULTATION: ANEMIA   HEMATOLOGY HISTORY:  # ANEMIA/thrombocytosis secondary to iron deficiency- MAY 2022- Hb 12; MCV 69; ferritin 3; saturation 5% EGD/ colonoscopy NONE  #Gender affirmation therapy [on testosterone injections; [place] family-planning]  HISTORY OF PRESENTING ILLNESS:  [name] 30 y.o.  female has been referred to [place] for further evaluation/work-up for anemia/thrombocytosis.  Patient is currently undergoing gender affirmation therapy with testosterone injections.   Blood in stools while constipated:  on wiping little blood.  Change in bowel habits- constipation- not on any medication Blood in urine: None Difficulty swallowing: None Abnormal weight loss: None Iron supplementation: > 1 month ago was on iron; stopped sec to hives [prednisone] Prior Blood transfusions:  Bariatric surgery: None EGD/Colonoscopy: none   Vaginal bleeding: None; menstrual periods stopped in Jan 2022.   Review of Systems  Constitutional: Negative for chills, diaphoresis, fever and weight loss.  HENT: Negative for nosebleeds and sore throat.   Eyes: Negative for double vision.  Respiratory: Negative for cough, hemoptysis, sputum production, shortness of breath and wheezing.   Cardiovascular: Negative for chest pain, palpitations, orthopnea and leg swelling.  Gastrointestinal: Negative for abdominal pain, blood in stool, constipation, diarrhea, heartburn, melena, nausea and vomiting.  Genitourinary: Negative for dysuria, frequency and urgency.  Musculoskeletal: Negative for back pain and joint pain.  Skin: Negative.  Negative for itching and rash.  Neurological: Negative for dizziness, tingling, focal weakness, weakness and headaches.  Endo/Heme/Allergies: Does not bruise/bleed easily.  Psychiatric/Behavioral: Negative for  depression. The patient is not nervous/anxious and does not have insomnia.     MEDICAL HISTORY:  Past Medical History:  Diagnosis Date  . Allergy   . Asthma   . Hives   . Microcytic anemia 03/13/2016    SURGICAL HISTORY: Past Surgical History:  Procedure Laterality Date  . WISDOM TOOTH EXTRACTION      SOCIAL HISTORY: Social History   Socioeconomic History  . Marital status: Single    Spouse name: Not on file  . Number of children: Not on file  . Years of education: Not on file  . Highest education level: Not on file  Occupational History  . Not on file  Tobacco Use  . Smoking status: Never Smoker  . Smokeless tobacco: Never Used  Vaping Use  . Vaping Use: Never used  Substance and Sexual Activity  . Alcohol use: No    Alcohol/week: 0.0 standard drinks  . Drug use: No  . Sexual activity: Yes    Partners: Female  Other Topics Concern  . Not on file  Social History Narrative   Lives in [place] with wife/and son. Working in [employer] traffic [employer]; no alcohol; no smoking.    Social Determinants of Health   Financial Resource Strain: Not on file  Food Insecurity: Not on file  Transportation Needs: Not on file  Physical Activity: Not on file  Stress: Not on file  Social Connections: Not on file  Intimate Partner Violence: Not on file    FAMILY HISTORY: Family History  Problem Relation Age of Onset  . Diabetes Father   . Diabetes Maternal Grandfather   . Cancer Maternal Grandfather        lung    ALLERGIES:  has No Known Allergies.  MEDICATIONS:  Current Outpatient Medications  Medication Sig Dispense Refill  . albuterol (VENTOLIN HFA) 108 (90 Base) MCG/ACT inhaler Inhale  2 puffs into the lungs every 6 (six) hours as needed for wheezing or shortness of breath. 18 g 1  . B-D SYRINGE LUER-LOK 1CC 1 ML MISC See admin instructions.    . BD HYPODERMIC NEEDLE 18G X 1" MISC See admin instructions.    . predniSONE (DELTASONE) 10 MG tablet Take 1 tablet (10 mg  total) by mouth daily with breakfast. 30 tablet 0  . testosterone cypionate (DEPOTESTOSTERONE CYPIONATE) 200 MG/ML injection SMARTSIG:0.5 Milliliter(s) SUB-Q Once a Week     No current facility-administered medications for this visit.      PHYSICAL EXAMINATION:   Vitals:   04/05/21 1137  BP: 115/72  Pulse: 73  Resp: 16  Temp: (!) 96 F (35.6 C)  SpO2: 99%   Filed Weights   04/05/21 1137  Weight: 102.5 kg    Physical Exam HENT:     Head: Normocephalic and atraumatic.     Mouth/Throat:     Pharynx: No oropharyngeal exudate.  Eyes:     Pupils: Pupils are equal, round, and reactive to light.  Cardiovascular:     Rate and Rhythm: Normal rate and regular rhythm.  Pulmonary:     Effort: No respiratory distress.     Breath sounds: No wheezing.  Abdominal:     General: Bowel sounds are normal. There is no distension.     Palpations: Abdomen is soft. There is no mass.     Tenderness: There is no abdominal tenderness. There is no guarding or rebound.  Musculoskeletal:        General: No tenderness. Normal range of motion.     Cervical back: Normal range of motion and neck supple.  Skin:    General: Skin is warm.  Neurological:     Mental Status: She is alert and oriented to person, place, and time.  Psychiatric:        Mood and Affect: Affect normal.     LABORATORY DATA:  I have reviewed the data as listed Lab Results  Component Value Date   WBC 11.8 (H) 04/05/2021   HGB 12.0 04/05/2021   HCT 41.0 04/05/2021   MCV 69.6 (L) 04/05/2021   PLT 497 (H) 04/05/2021   Recent Labs    04/05/21 1217  NA 137  K 3.9  CL 105  CO2 24  GLUCOSE 90  BUN 8  CREATININE 0.69  CALCIUM 9.1  GFRNONAA >60  PROT 7.3  ALBUMIN 3.8  AST 17  ALT 17  ALKPHOS 62  BILITOT 0.7     No results found.  Microcytic anemia #Microcytic anemia hemoglobin around 10; MCV 60s; elevated platelets-likely secondary iron deficiency; recommend CBC/CMP LDH/haptoglobin/iron studies and  ferritin.  P.o. iron started because of hives [see below]  #Recommend iron supplementation-recommend alternate p.o. iron.  Await labs.  #Etiology: Of iron deficiency unclear; patient has not had any heavy menstrual cycles.  Given the intermittent blood in stool-recommend GI evaluation.   # Hives-[? PO iron vs other] on prednisone [awaiting ENT/Allergy doc]  # DISPOSITION:will call  # labs today # follow up TBD-Dr.B  Addendum: Hemoglobin 12; iron studies suggestive of iron deficiency.  Recommend p.o. iron/alternate.  Hold off IV iron infusion.Recommend GI evaluation  Will inform patient.     All questions were answered. The patient knows to call the clinic with any problems, questions or concerns.   Earna Coder, MD 04/06/2021 4:11 PM

## 2021-04-05 NOTE — Assessment & Plan Note (Addendum)
#  Microcytic anemia hemoglobin around 10; MCV 60s; elevated platelets-likely secondary iron deficiency; recommend CBC/CMP LDH/haptoglobin/iron studies and ferritin.  P.o. iron started because of hives [see below]  #Recommend iron supplementation-recommend alternate p.o. iron.  Await labs.  #Etiology: Of iron deficiency unclear; patient has not had any heavy menstrual cycles.  Given the intermittent blood in stool-recommend GI evaluation.   # Hives-[? PO iron vs other] on prednisone [awaiting ENT/Allergy doc]  # DISPOSITION:will call  # labs today # follow up TBD-Dr.B  Addendum: Hemoglobin 12; iron studies suggestive of iron deficiency.  Recommend p.o. iron/alternate.  Hold off IV iron infusion.Recommend GI evaluation  Will inform patient.

## 2021-04-06 ENCOUNTER — Encounter: Payer: Self-pay | Admitting: Nurse Practitioner

## 2021-04-06 LAB — HAPTOGLOBIN: Haptoglobin: 172 mg/dL (ref 33–278)

## 2021-04-24 ENCOUNTER — Telehealth: Payer: Self-pay | Admitting: *Deleted

## 2021-04-24 DIAGNOSIS — D509 Iron deficiency anemia, unspecified: Secondary | ICD-10-CM

## 2021-04-24 NOTE — Telephone Encounter (Signed)
-----   Message from Earna Coder, MD sent at 04/23/2021  4:13 PM EDT ----- H/T- please inform pt that continue PO iron [65 mg over the counter] for now. Follow up in 6 month; MD; labs- cbc/bmp;iron studies/ferritin-Dr.B

## 2021-04-25 NOTE — Progress Notes (Signed)
My chart msg sent to patient. 

## 2021-05-01 ENCOUNTER — Encounter: Payer: Self-pay | Admitting: Internal Medicine

## 2021-05-06 LAB — HM PAP SMEAR

## 2021-06-26 ENCOUNTER — Emergency Department
Admission: EM | Admit: 2021-06-26 | Discharge: 2021-06-26 | Disposition: A | Discharge status: Home / Self Care | Payer: 59 | Attending: Emergency Medicine | Admitting: Emergency Medicine

## 2021-06-26 ENCOUNTER — Ambulatory Visit: Payer: Self-pay | Admitting: *Deleted

## 2021-06-26 ENCOUNTER — Other Ambulatory Visit: Payer: Self-pay

## 2021-06-26 ENCOUNTER — Encounter: Payer: Self-pay | Admitting: Family Medicine

## 2021-06-26 DIAGNOSIS — M545 Low back pain, unspecified: Secondary | ICD-10-CM | POA: Diagnosis not present

## 2021-06-26 DIAGNOSIS — M5442 Lumbago with sciatica, left side: Secondary | ICD-10-CM

## 2021-06-26 DIAGNOSIS — J45909 Unspecified asthma, uncomplicated: Secondary | ICD-10-CM | POA: Insufficient documentation

## 2021-06-26 DIAGNOSIS — M5441 Lumbago with sciatica, right side: Secondary | ICD-10-CM

## 2021-06-26 LAB — POC URINE PREG, ED: Preg Test, Ur: NEGATIVE

## 2021-06-26 LAB — URINALYSIS, COMPLETE (UACMP) WITH MICROSCOPIC
Bacteria, UA: NONE SEEN
Bilirubin Urine: NEGATIVE
Glucose, UA: NEGATIVE mg/dL
Hgb urine dipstick: NEGATIVE
Ketones, ur: 5 mg/dL — AB
Nitrite: NEGATIVE
Protein, ur: NEGATIVE mg/dL
Specific Gravity, Urine: 1.023 (ref 1.005–1.030)
pH: 5 (ref 5.0–8.0)

## 2021-06-26 MED ORDER — METHOCARBAMOL 500 MG PO TABS: 500.0000 mg | ORAL_TABLET | Freq: Three times a day (TID) | ORAL | 0 refills | Status: AC | PRN

## 2021-06-26 MED ORDER — KETOROLAC TROMETHAMINE 30 MG/ML IJ SOLN
30.0000 mg | Freq: Once | INTRAMUSCULAR | Status: AC
  Administered 2021-06-26: 30 mg via INTRAMUSCULAR
  Filled 2021-06-26: qty 1

## 2021-06-26 MED ORDER — PREDNISONE 10 MG (21) PO TBPK: ORAL_TABLET | ORAL | 0 refills | Status: DC

## 2021-06-26 MED ORDER — HYDROCODONE-ACETAMINOPHEN 5-325 MG PO TABS
1.0000 | ORAL_TABLET | Freq: Once | ORAL | Status: AC
  Administered 2021-06-26: 1 via ORAL
  Filled 2021-06-26: qty 1

## 2021-06-26 NOTE — ED Triage Notes (Signed)
Pt arrived to ed via pov with c/o lower back pain. Pain started when felt popping sound while picking up case of water. NAD noted at this tme

## 2021-06-26 NOTE — ED Provider Notes (Signed)
[facility]-EMERGENCY DEPARTMENT  ____________________________________________  Time seen: Approximately 6:31 PM  I have reviewed the triage vital signs and the nursing notes.   HISTORY  Chief Complaint Back Pain   Historian Patient     HPI [name] is a 30 y.o. adult presents to the emergency department with 8 out of 10 low back pain the patient describes as bilateral and radiating into the buttocks and lower extremities bilaterally.  Patient states that pain started when he bent over to pick up a case of water.  No bowel or bladder incontinence or saddle anesthesia.  No other alleviating measures have been attempted.   Past Medical History:  Diagnosis Date   Allergy    Asthma    Hives    Microcytic anemia 03/13/2016     Immunizations up to date:  Yes.     Past Medical History:  Diagnosis Date   Allergy    Asthma    Hives    Microcytic anemia 03/13/2016    Patient Active Problem List   Diagnosis Date Noted   Urticaria 04/02/2016   Microcytic anemia 03/13/2016   Pain in both hands 03/13/2016   Pain in both feet 03/13/2016   Abnormal weight gain 03/13/2016    Past Surgical History:  Procedure Laterality Date   WISDOM TOOTH EXTRACTION      Prior to Admission medications   Medication Sig Start Date End Date Taking? Authorizing Provider  albuterol (VENTOLIN HFA) 108 (90 Base) MCG/ACT inhaler Inhale 2 puffs into the lungs every 6 (six) hours as needed for wheezing or shortness of breath. 12/15/19   Doren Custard, FNP  B-D SYRINGE LUER-LOK 1CC 1 ML MISC See admin instructions. 11/16/20   [provider]  BD HYPODERMIC NEEDLE 18G X 1" MISC See admin instructions. 11/11/20   [provider]  predniSONE (DELTASONE) 10 MG tablet Take 1 tablet (10 mg total) by mouth daily with breakfast. 03/10/21   Duanne Limerick, MD  testosterone cypionate (DEPOTESTOSTERONE CYPIONATE) 200 MG/ML injection SMARTSIG:0.5 Milliliter(s) SUB-Q Once a Week 11/11/20   [provider]    Allergies Patient has no known allergies.  Family History  Problem Relation Age of Onset   Diabetes Father    Diabetes Maternal Grandfather    Cancer Maternal Grandfather        lung    Social History Social History   Tobacco Use   Smoking status: Never   Smokeless tobacco: Never  Vaping Use   Vaping Use: Never used  Substance Use Topics   Alcohol use: No    Alcohol/week: 0.0 standard drinks   Drug use: No     Review of Systems  Constitutional: No fever/chills Eyes:  No discharge ENT: No upper respiratory complaints. Respiratory: no cough. No SOB/ use of accessory muscles to breath Gastrointestinal:   No nausea, no vomiting.  No diarrhea.  No constipation. Musculoskeletal: Patient has low back pain.  Skin: Negative for rash, abrasions, lacerations, ecchymosis.   ____________________________________________   PHYSICAL EXAM:  VITAL SIGNS: ED Triage Vitals  Enc Vitals Group     BP 06/26/21 1523 111/71     Pulse Rate 06/26/21 1523 90     Resp 06/26/21 1523 16     Temp 06/26/21 1523 98.4 F (36.9 C)     Temp Source 06/26/21 1523 Oral     SpO2 06/26/21 1523 99 %     Weight 06/26/21 1524 220 lb (99.8 kg)     Height 06/26/21 1524 5'  5" (1.651 m)     Head Circumference --      Peak Flow --      Pain Score 06/26/21 1524 9     Pain Loc --      Pain Edu? --      Excl. in GC? --      Constitutional: Alert and oriented. Well appearing and in no acute distress. Eyes: Conjunctivae are normal. PERRL. EOMI. Head: Atraumatic. ENT: Cardiovascular: Normal rate, regular rhythm. Normal S1 and S2.  Good peripheral circulation. Respiratory: Normal respiratory effort without tachypnea or retractions. Lungs CTAB. Good air entry to the bases with no decreased or absent breath sounds Gastrointestinal: Bowel sounds x 4 quadrants. Soft and nontender to palpation. No guarding or rigidity. No distention. Musculoskeletal: Full range of motion to all  extremities. No obvious deformities noted. Patient has paraspinal muscle tenderness along the lumbar spine.  Neurologic:  Normal for age. No gross focal neurologic deficits are appreciated.  Skin:  Skin is warm, dry and intact. No rash noted. Psychiatric: Mood and affect are normal for age. Speech and behavior are normal.   ____________________________________________   LABS (all labs ordered are listed, but only abnormal results are displayed)  Labs Reviewed  URINALYSIS, COMPLETE (UACMP) WITH MICROSCOPIC  POC URINE PREG, ED   ____________________________________________  EKG   ____________________________________________  RADIOLOGY   No results found.  ____________________________________________    PROCEDURES  Procedure(s) performed:     Procedures     Medications - No data to display   ____________________________________________   INITIAL IMPRESSION / ASSESSMENT AND PLAN / ED COURSE  Pertinent labs & imaging results that were available during my care of the patient were reviewed by me and considered in my medical decision making (see chart for details).      Assessment and Plan:  Low back pain 30 year old female presents to the emergency department with low back pain that radiates down the posterior aspect of the bilateral lower extremities. Exam vital signs are reassuring at triage.  On exam, patient was alert, active and nontoxic-appearing.  Urinalysis shows no signs of UTI.  Patient felt significant improvement in pain after Toradol and Norco.  He was discharged with tapered prednisone and Robaxin.  Return precautions were given to return with new or worsening symptoms.     ____________________________________________  FINAL CLINICAL IMPRESSION(S) / ED DIAGNOSES  Final diagnoses:  None      NEW MEDICATIONS STARTED DURING THIS VISIT:  ED Discharge Orders     None           This chart was dictated using voice recognition  software/Dragon. Despite best efforts to proofread, errors can occur which can change the meaning. Any change was purely unintentional.     [name] 06/26/21 2032    [name], MD 06/26/21 2142

## 2021-06-26 NOTE — Telephone Encounter (Signed)
Reason for Disposition  [1] Numbness (i.e., loss of sensation) of the leg(s) or foot AND [2] sudden onset after back injury  [1] Loss of bladder or bowel control (urine or bowel incontinence; wetting self, leaking stool) AND [2] new-onset  Numbness of a leg or foot (i.e., loss of sensation)  Answer Assessment - Initial Assessment Questions 1. MECHANISM: "How did the injury happen?" (Consider the possibility of domestic violence or elder abuse)     Yesterday picking up a case of water , felt something pop in back  2. ONSET: "When did the injury happen?" (Minutes or hours ago)     Yesterday 06/25/21 3. LOCATION: "What part of the back is injured?"     Middle to lower  4. SEVERITY: "Can you move the back normally?"     No  5. PAIN: "Is there any pain?" If Yes, ask: "How bad is the pain?"   (Scale 1-10; or mild, moderate, severe)     severe 6. CORD SYMPTOMS: Any weakness or numbness of the arms or legs?"     Bilateral numbness and tingling in legs  7. SIZE: For cuts, bruises, or swelling, ask: "How large is it?" (e.g., inches or centimeters)     none 8. TETANUS: For any breaks in the skin, ask: "When was the last tetanus booster?"     None  9. OTHER SYMPTOMS: "Do you have any other symptoms?" (e.g., abdominal pain, blood in urine)     Lower abdominal pain , pain in middle to lower back , feels "almost "incontinent of bowel and bladder" 10. PREGNANCY: "Is there any chance you are pregnant?" "When was your last menstrual period?"       na  Protocols used: Back Injury-A-AH

## 2021-06-26 NOTE — Telephone Encounter (Signed)
C/o numbness and tingling in bilateral legs since yesterday 06/25/21 after picking up a case of water and heard a pop. C/o bilateral legs numb and tingling and pain in middle to lower back and lower abdomen. C/o "almost" losing control of bowel and bladder. Difficulty walking and c/o severe back pain. Instructed patient to go to ED now for evaluation. Called patient again and instructed patient to call 911 to transfer to ED. No answer left message for patient to call 911. Care advise given. Patient verbalized someone was with her to take to ED now .

## 2021-06-27 ENCOUNTER — Other Ambulatory Visit: Payer: Self-pay

## 2021-06-27 ENCOUNTER — Emergency Department
Admission: EM | Admit: 2021-06-27 | Discharge: 2021-06-27 | Disposition: A | Discharge status: Home / Self Care | Payer: 59 | Attending: Emergency Medicine | Admitting: Emergency Medicine

## 2021-06-27 DIAGNOSIS — T7840XA Allergy, unspecified, initial encounter: Secondary | ICD-10-CM | POA: Diagnosis not present

## 2021-06-27 DIAGNOSIS — J45909 Unspecified asthma, uncomplicated: Secondary | ICD-10-CM | POA: Diagnosis not present

## 2021-06-27 DIAGNOSIS — R600 Localized edema: Secondary | ICD-10-CM

## 2021-06-27 DIAGNOSIS — X58XXXA Exposure to other specified factors, initial encounter: Secondary | ICD-10-CM | POA: Diagnosis not present

## 2021-06-27 DIAGNOSIS — L299 Pruritus, unspecified: Secondary | ICD-10-CM | POA: Diagnosis not present

## 2021-06-27 DIAGNOSIS — R220 Localized swelling, mass and lump, head: Secondary | ICD-10-CM | POA: Insufficient documentation

## 2021-06-27 MED ORDER — METHYLPREDNISOLONE SODIUM SUCC 125 MG IJ SOLR
125.0000 mg | Freq: Once | INTRAMUSCULAR | Status: AC
  Administered 2021-06-27: 125 mg via INTRAVENOUS
  Filled 2021-06-27: qty 2

## 2021-06-27 MED ORDER — FAMOTIDINE IN NACL 20-0.9 MG/50ML-% IV SOLN
20.0000 mg | Freq: Once | INTRAVENOUS | Status: AC
  Administered 2021-06-27: 20 mg via INTRAVENOUS
  Filled 2021-06-27: qty 50

## 2021-06-27 MED ORDER — DIPHENHYDRAMINE HCL 50 MG/ML IJ SOLN
50.0000 mg | Freq: Once | INTRAMUSCULAR | Status: AC
  Administered 2021-06-27: 50 mg via INTRAVENOUS
  Filled 2021-06-27: qty 1

## 2021-06-27 NOTE — ED Triage Notes (Signed)
Pt to ED for angioedema and bilateral eye swelling. Tongue not noted to be swollen. Pt is able to speak in full sentences. Pt was given IM toradol at hospital yesterday for back pain. Pt has allergy to aspirin.  Alert and oriented. 99% on RA.

## 2021-06-27 NOTE — ED Provider Notes (Signed)
[facility] Emergency Department Provider Note   ____________________________________________   Event Date/Time   First MD Initiated Contact with Patient 06/27/21 1103     (approximate)  I have reviewed the triage vital signs and the nursing notes.   HISTORY  Chief Complaint Allergic Reaction (Lip and eye swelling bilateral) and Facial Swelling    HPI [name] is a 30 y.o. adult (female to female transgender) with past medical history of asthma and anemia who presents to the ED complaining of facial swelling.  Patient reports that he was seen in the ED yesterday for back pain, given a dose of Toradol at that time and reports an allergy to aspirin.  He states that shortly after returning home from the ED, he started to notice swelling to his lips and eyes.  He states some difficulty breathing yesterday but this has resolved today.  He also had diffuse itchy rash at the time of onset of swelling, however this is also resolved.  He denies any lightheadedness, nausea, vomiting, or diarrhea.  He has never had a similar reaction in the past and denies ever having to use an EpiPen.        Past Medical History:  Diagnosis Date   Allergy    Asthma    Hives    Microcytic anemia 03/13/2016    Patient Active Problem List   Diagnosis Date Noted   Urticaria 04/02/2016   Microcytic anemia 03/13/2016   Pain in both hands 03/13/2016   Pain in both feet 03/13/2016   Abnormal weight gain 03/13/2016    Past Surgical History:  Procedure Laterality Date   WISDOM TOOTH EXTRACTION      Prior to Admission medications   Medication Sig Start Date End Date Taking? Authorizing Provider  albuterol (VENTOLIN HFA) 108 (90 Base) MCG/ACT inhaler Inhale 2 puffs into the lungs every 6 (six) hours as needed for wheezing or shortness of breath. 12/15/19   Doren Custard, FNP  B-D SYRINGE LUER-LOK 1CC 1 ML MISC See admin instructions. 11/16/20   [provider]  BD  HYPODERMIC NEEDLE 18G X 1" MISC See admin instructions. 11/11/20   [provider]  methocarbamol (ROBAXIN) 500 MG tablet Take 1 tablet (500 mg total) by mouth every 8 (eight) hours as needed for up to 5 days. 06/26/21 07/01/21  Orvil Feil, PA-C  predniSONE (STERAPRED UNI-PAK 21 TAB) 10 MG (21) TBPK tablet 6,6,5,5,4,4,3,3,2,2,1,1 06/26/21   Pia Mau M, PA-C  testosterone cypionate (DEPOTESTOSTERONE CYPIONATE) 200 MG/ML injection SMARTSIG:0.5 Milliliter(s) SUB-Q Once a Week 11/11/20   [provider]    Allergies Aspirin and Toradol [ketorolac tromethamine]  Family History  Problem Relation Age of Onset   Diabetes Father    Diabetes Maternal Grandfather    Cancer Maternal Grandfather        lung    Social History Social History   Tobacco Use   Smoking status: Never   Smokeless tobacco: Never  Vaping Use   Vaping Use: Never used  Substance Use Topics   Alcohol use: No    Alcohol/week: 0.0 standard drinks   Drug use: No    Review of Systems  Constitutional: No fever/chills Eyes: No visual changes. ENT: No sore throat.  Positive for facial swelling. Cardiovascular: Denies chest pain. Respiratory: Denies shortness of breath. Gastrointestinal: No abdominal pain.  No nausea, no vomiting.  No diarrhea.  No constipation. Genitourinary: Negative for dysuria. Musculoskeletal: Negative for back pain. Skin: Positive for rash. Neurological:  Negative for headaches, focal weakness or numbness.  ____________________________________________   PHYSICAL EXAM:  VITAL SIGNS: ED Triage Vitals [06/27/21 1103]  Enc Vitals Group     BP 115/74     Pulse Rate 86     Resp 18     Temp 98.6 F (37 C)     Temp Source Oral     SpO2 99 %     Weight      Height      Head Circumference      Peak Flow      Pain Score      Pain Loc      Pain Edu?      Excl. in GC?     Constitutional: Alert and oriented. Eyes: Conjunctivae are normal.  Lid edema noted  bilaterally. Head: Atraumatic. Nose: No congestion/rhinnorhea. Mouth/Throat: Mucous membranes are moist.  Edema to bilateral upper and lower lip, no tongue or oropharyngeal edema noted. Neck: Normal ROM Cardiovascular: Normal rate, regular rhythm. Grossly normal heart sounds. Respiratory: Normal respiratory effort.  No retractions. Lungs CTAB. Gastrointestinal: Soft and nontender. No distention. Genitourinary: deferred Musculoskeletal: No lower extremity tenderness nor edema. Neurologic:  Normal speech and language. No gross focal neurologic deficits are appreciated. Skin:  Skin is warm, dry and intact. No rash noted. Psychiatric: Mood and affect are normal. Speech and behavior are normal.  ____________________________________________   LABS (all labs ordered are listed, but only abnormal results are displayed)  Labs Reviewed - No data to display   PROCEDURES  Procedure(s) performed (including Critical Care):  Procedures   ____________________________________________   INITIAL IMPRESSION / ASSESSMENT AND PLAN / ED COURSE      30 year old female to female transgender patient who presents to the ED complaining of lip and eyelid swelling since last night after receiving medication in the ED for back pain.  It appears this is likely due to Toradol dose he received given reported prior allergy to aspirin.  She has edema of lips and eyelids but no tongue or posterior oropharyngeal edema and he is not in any respiratory distress.  No signs of anaphylaxis at this time, we will hold off on epinephrine.  We will treat with IV Solu-Medrol, Benadryl, and Pepcid.  We will observe here in the ED to ensure symptoms are improving.  Swelling around patient's eyes and lips seems to be gradually improving over her time in the ED.  She continues to have stable vital signs with no signs of anaphylaxis.  After approximately 3 hours of observation she is appropriate for discharge home with PCP  follow-up.  She was previously prescribed steroid taper during ED visit yesterday, was counseled to complete this medication and to avoid NSAIDs.  She was counseled to return to the ED for new worsening symptoms, patient agrees with plan.      ____________________________________________   FINAL CLINICAL IMPRESSION(S) / ED DIAGNOSES  Final diagnoses:  Allergic reaction, initial encounter  Facial edema     ED Discharge Orders     None        Note:  This document was prepared using Dragon voice recognition software and may include unintentional dictation errors.    Chesley Noon, MD 06/27/21 1351

## 2021-06-27 NOTE — ED Notes (Signed)
Dr Larinda Buttery at bedside talking with pt.

## 2021-06-27 NOTE — ED Notes (Signed)
Pt signed paper consent to discharge. 

## 2021-06-27 NOTE — ED Notes (Signed)
Pt resting in bed. Face visibly less swollen. SPO2 97% on RA.

## 2021-06-27 NOTE — ED Notes (Signed)
Dr Larinda Buttery has seen pt. Pt states that last night he also had hives. Pt goes  By he/him/his.

## 2021-07-27 ENCOUNTER — Other Ambulatory Visit: Payer: Self-pay

## 2021-07-27 ENCOUNTER — Encounter: Payer: Self-pay | Admitting: Family Medicine

## 2021-07-27 ENCOUNTER — Ambulatory Visit (INDEPENDENT_AMBULATORY_CARE_PROVIDER_SITE_OTHER): Payer: 59 | Admitting: Family Medicine

## 2021-07-27 VITALS — BP 130/70 | HR 68 | Ht 65.0 in | Wt 226.0 lb

## 2021-07-27 DIAGNOSIS — M545 Low back pain, unspecified: Secondary | ICD-10-CM | POA: Diagnosis not present

## 2021-07-27 DIAGNOSIS — M79605 Pain in left leg: Secondary | ICD-10-CM

## 2021-07-27 NOTE — Progress Notes (Signed)
Date:  07/27/2021   Name:  [name]   DOB:  [birth year]   MRN:  [id number]   Chief Complaint: Back Pain (Lower back pain radiating down left leg. )  Back Pain This is a new problem. The current episode started more than 1 month ago. The problem occurs intermittently. The problem has been gradually improving since onset. The pain is present in the lumbar spine. The quality of the pain is described as stabbing. The pain radiates to the left thigh, left knee and left foot. The pain is at a severity of 3/10. The pain is mild. The pain is Worse during the night. The symptoms are aggravated by sitting and position. Associated symptoms include leg pain, tingling and weakness. Pertinent negatives include no abdominal pain, bladder incontinence, bowel incontinence, chest pain, dysuria, fever, headaches, numbness, paresis, paresthesias, pelvic pain, perianal numbness or weight loss. Risk factors include recent trauma (lifted a box). He has tried bed rest and home exercises for the symptoms. The treatment provided mild relief.   Lab Results  Component Value Date   CREATININE 0.69 04/05/2021   BUN 8 04/05/2021   NA 137 04/05/2021   K 3.9 04/05/2021   CL 105 04/05/2021   CO2 24 04/05/2021   No results found for: CHOL, HDL, LDLCALC, LDLDIRECT, TRIG, CHOLHDL Lab Results  Component Value Date   TSH 2.040 09/20/2017   No results found for: HGBA1C Lab Results  Component Value Date   WBC 11.8 (H) 04/05/2021   HGB 12.0 04/05/2021   HCT 41.0 04/05/2021   MCV 69.6 (L) 04/05/2021   PLT 497 (H) 04/05/2021   Lab Results  Component Value Date   ALT 17 04/05/2021   AST 17 04/05/2021   ALKPHOS 62 04/05/2021   BILITOT 0.7 04/05/2021     Review of Systems  Constitutional:  Negative for chills, fever and weight loss.  HENT:  Negative for drooling, ear discharge, ear pain and sore throat.   Respiratory:  Negative for cough, shortness of breath and wheezing.   Cardiovascular:  Negative for  chest pain, palpitations and leg swelling.  Gastrointestinal:  Negative for abdominal pain, blood in stool, bowel incontinence, constipation, diarrhea and nausea.  Endocrine: Negative for polydipsia.  Genitourinary:  Negative for bladder incontinence, dysuria, frequency, hematuria, pelvic pain and urgency.  Musculoskeletal:  Positive for back pain. Negative for myalgias and neck pain.  Skin:  Negative for rash.  Allergic/Immunologic: Negative for environmental allergies.  Neurological:  Positive for tingling and weakness. Negative for dizziness, numbness, headaches and paresthesias.  Hematological:  Does not bruise/bleed easily.  Psychiatric/Behavioral:  Negative for suicidal ideas. The patient is not nervous/anxious.    Patient Active Problem List   Diagnosis Date Noted   Urticaria 04/02/2016   Microcytic anemia 03/13/2016   Pain in both hands 03/13/2016   Pain in both feet 03/13/2016   Abnormal weight gain 03/13/2016    Allergies  Allergen Reactions   Aspirin Swelling    Facial swelling   Toradol [Ketorolac Tromethamine] Swelling    Lip and eye swelling, rash    Past Surgical History:  Procedure Laterality Date   WISDOM TOOTH EXTRACTION      Social History   Tobacco Use   Smoking status: Never   Smokeless tobacco: Never  Vaping Use   Vaping Use: Never used  Substance Use Topics   Alcohol use: No    Alcohol/week: 0.0 standard drinks   Drug use: No     Medication list  has been reviewed and updated.  Current Meds  Medication Sig   albuterol (VENTOLIN HFA) 108 (90 Base) MCG/ACT inhaler Inhale 2 puffs into the lungs every 6 (six) hours as needed for wheezing or shortness of breath.   B-D SYRINGE LUER-LOK 1CC 1 ML MISC See admin instructions.   BD HYPODERMIC NEEDLE 18G X 1" MISC See admin instructions.   testosterone cypionate (DEPOTESTOSTERONE CYPIONATE) 200 MG/ML injection SMARTSIG:0.5 Milliliter(s) SUB-Q Once a Week    PHQ 2/9 Scores 12/08/2020 12/15/2019  09/20/2017 03/13/2016  PHQ - 2 Score 0 0 0 0  PHQ- 9 Score 3 0 - -    GAD 7 : Generalized Anxiety Score 12/08/2020  Nervous, Anxious, on Edge 1  Control/stop worrying 0  Worry too much - different things 0  Trouble relaxing 0  Restless 0  Easily annoyed or irritable 0  Afraid - awful might happen 0  Total GAD 7 Score 1    BP Readings from Last 3 Encounters:  06/27/21 101/69  06/26/21 105/62  04/05/21 115/72    Physical Exam Vitals and nursing note reviewed.  Cardiovascular:     Heart sounds: Normal heart sounds.  Pulmonary:     Breath sounds: Normal breath sounds.  Musculoskeletal:     Lumbar back: Spasms present. No swelling, deformity, tenderness or bony tenderness. Decreased range of motion. Negative right straight leg raise test and negative left straight leg raise test.  Neurological:     Sensory: Sensation is intact.     Motor: Weakness present.     Deep Tendon Reflexes:     Reflex Scores:      Patellar reflexes are 2+ on the right side and 2+ on the left side.    Comments: Decrease motor left leg    Wt Readings from Last 3 Encounters:  07/27/21 226 lb (102.5 kg)  06/27/21 220 lb (99.8 kg)  06/26/21 220 lb (99.8 kg)    Ht 5\' 5"  (1.651 m)   Wt 226 lb (102.5 kg)   BMI 37.61 kg/m   Assessment and Plan: 1. Lumbar pain with radiation down left leg New onset.  Persistent.  Concerning because of weakness in the left leg.  Radiculopathy to left leg.  Patient was lifting a box approximately 4 to 5 weeks ago and sustained a lower back pain was seen in the emergency room treated with Toradol and prednisone.  Patient had a allergic reaction to the Toradol but did take the prednisone.  Patient still continues to have lower back pain although that it is improved somewhat but has noted some weakness in his left leg.  This point time we began information for lumbar disc for likely radiculopathy and referral to orthopedics S[place] Monday for evaluation with possible further  radiologic eval and further therapeutics.  I am reluctant to initiate physical therapy until evaluated at the orthopedic level. - Ambulatory referral to Orthopedic Surgery

## 2021-07-27 NOTE — Patient Instructions (Signed)
  Radicular Pain Radicular pain is a type of pain that spreads from your back or neck along a spinal nerve. Spinal nerves are nerves that leave the spinal cord and go to the muscles. Radicular pain is sometimes called radiculopathy, radiculitis, or a pinched nerve. When you have this type of pain, you may also have weakness, numbness, or tingling in the area of your body that is supplied by the nerve. The pain may feel sharp and burning. Depending on which spinal nerve is affected, the pain may occur in the: Neck area (cervical radicular pain). You may also feel pain, numbness, weakness, or tingling in the arms. Mid-spine area (thoracic radicular pain). You would feel this pain in the back and chest. This type is rare. Lower back area (lumbar radicular pain). You would feel this pain as low back pain. You may feel pain, numbness, weakness, or tingling in the buttocks or legs. Sciatica is a type of lumbar radicular pain that shoots down the back of the leg. Radicular pain occurs when one of the spinal nerves becomes irritated or squeezed (compressed). It is often caused by something pushing on a spinal nerve, such as one of the bones of the spine (vertebrae) or one of the round cushions between vertebrae (intervertebral disks). This can result from: An injury. Wear and tear or aging of a disk. The growth of a bone spur that pushes on the nerve. Radicular pain often goes away when you follow instructions from your healthcare provider for relieving pain at home. Follow these instructions at home: Managing pain     If directed, put ice on the affected area: Put ice in a plastic bag. Place a towel between your skin and the bag. Leave the ice on for 20 minutes, 2-3 times a day. If directed, apply heat to the affected area as often as told by your health care provider. Use the heat source that your health care provider recommends, such as a moist heat pack or a heating pad. Place a towel between  your skin and the heat source. Leave the heat on for 20-30 minutes. Remove the heat if your skin turns bright red. This is especially important if you are unable to feel pain, heat, or cold. You may have a greater risk of getting burned. Activity  Do not sit or rest in bed for long periods of time. Try to stay as active as possible. Ask your health care provider what type of exercise or activity is best for you. Avoid activities that make your pain worse, such as bending and lifting. Do not lift anything that is heavier than 10 lb (4.5 kg), or the limit that you are told, until your health care provider says that it is safe. Practice using proper technique when lifting items. Proper lifting technique involves bending your knees and rising up. Do strength and range-of-motion exercises only as told by your health care provider or physical therapist.  General instructions Take over-the-counter and prescription medicines only as told by your health care provider. Pay attention to any changes in your symptoms. Keep all follow-up visits as told by your health care provider. This is important. Your health care provider may send you to a physical therapist to help with this pain. Contact a health care provider if: Your pain and other symptoms get worse. Your pain medicine is not helping. Your pain has not improved after a few weeks of home care. You have a fever. Get help right away if: You have   severe pain, weakness, or numbness. You have difficulty with bladder or bowel control. Summary Radicular pain is a type of pain that spreads from your back or neck along a spinal nerve. When you have radicular pain, you may also have weakness, numbness, or tingling in the area of your body that is supplied by the nerve. The pain may feel sharp or burning. Radicular pain may be treated with ice, heat, medicines, or physical therapy. This information is not intended to replace advice given to you by your  health care provider. Make sure you discuss any questions you have with your healthcare provider. Document Revised: 05/06/2018 Document Reviewed: 05/06/2018 [employer] Patient Education  2022 [employer]  

## 2021-10-06 ENCOUNTER — Inpatient Hospital Stay: Payer: 59 | Admitting: Internal Medicine

## 2021-10-06 ENCOUNTER — Inpatient Hospital Stay: Payer: 59 | Attending: Internal Medicine

## 2021-10-06 ENCOUNTER — Telehealth: Payer: Self-pay | Admitting: Internal Medicine

## 2021-10-06 ENCOUNTER — Encounter: Payer: Self-pay | Admitting: Internal Medicine

## 2021-10-06 NOTE — Telephone Encounter (Signed)
Called patient to reschedule missed 10/06/21 appt with no answer. Left VM to contact office for rescheduling. Mychart message also sent.
